# Patient Record
Sex: Female | Born: 1975 | Race: Black or African American | Hispanic: No | Smoking: Former smoker
Health system: Southern US, Community
[De-identification: ages and names within clinical notes are randomized; demographics above are authoritative.]

## PROBLEM LIST (undated history)

## (undated) ENCOUNTER — Ambulatory Visit: Admission: RE | Payer: Self-pay

## (undated) ENCOUNTER — Ambulatory Visit: Admission: RE | Payer: Self-pay | Admitting: Internal Medicine

## (undated) ENCOUNTER — Ambulatory Visit (INDEPENDENT_AMBULATORY_CARE_PROVIDER_SITE_OTHER): Admission: RE | Payer: Self-pay | Admitting: Psychiatry

## (undated) DIAGNOSIS — G35 Multiple sclerosis: Secondary | ICD-10-CM

## (undated) DIAGNOSIS — G35D Multiple sclerosis, unspecified: Secondary | ICD-10-CM

## (undated) DIAGNOSIS — E785 Hyperlipidemia, unspecified: Secondary | ICD-10-CM

## (undated) DIAGNOSIS — E119 Type 2 diabetes mellitus without complications: Secondary | ICD-10-CM

## (undated) DIAGNOSIS — E559 Vitamin D deficiency, unspecified: Secondary | ICD-10-CM

## (undated) DIAGNOSIS — F419 Anxiety disorder, unspecified: Secondary | ICD-10-CM

## (undated) DIAGNOSIS — E782 Mixed hyperlipidemia: Secondary | ICD-10-CM

## (undated) HISTORY — PX: APPENDECTOMY (OPEN): SHX54

## (undated) HISTORY — DX: Mixed hyperlipidemia: E78.2

## (undated) HISTORY — DX: Hyperlipidemia, unspecified: E78.5

## (undated) HISTORY — PX: APPENDECTOMY: SHX54

## (undated) HISTORY — DX: Vitamin D deficiency, unspecified: E55.9

---
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
## 1997-01-31 ENCOUNTER — Inpatient Hospital Stay
Admission: RE | Admit: 1997-01-31 | Disposition: A | Discharge status: Home / Self Care | Payer: Self-pay | Source: Ambulatory Visit | Admitting: Obstetrics & Gynecology

## 1997-05-18 ENCOUNTER — Inpatient Hospital Stay: Admit: 1997-05-18 | Disposition: A | Discharge status: Home / Self Care | Payer: Self-pay | Admitting: Internal Medicine

## 1997-05-24 ENCOUNTER — Ambulatory Visit: Admit: 1997-05-24 | Disposition: A | Discharge status: Home / Self Care | Payer: Self-pay | Admitting: Internal Medicine

## 1998-05-19 ENCOUNTER — Emergency Department: Admit: 1998-05-19 | Disposition: A | Discharge status: Home / Self Care | Payer: Self-pay

## 1998-08-20 ENCOUNTER — Emergency Department: Admit: 1998-08-20 | Disposition: A | Discharge status: Home / Self Care | Payer: Self-pay

## 1998-10-06 ENCOUNTER — Emergency Department
Admit: 1998-10-06 | Disposition: A | Discharge status: Home / Self Care | Payer: Self-pay | Admitting: Emergency Medicine

## 1999-01-09 ENCOUNTER — Emergency Department: Admit: 1999-01-09 | Disposition: A | Discharge status: Home / Self Care | Payer: Self-pay

## 1999-01-12 ENCOUNTER — Inpatient Hospital Stay: Admit: 1999-01-12 | Disposition: A | Discharge status: Home / Self Care | Payer: Self-pay | Admitting: Internal Medicine

## 1999-01-28 ENCOUNTER — Emergency Department
Admit: 1999-01-28 | Disposition: A | Discharge status: Home / Self Care | Payer: Self-pay | Admitting: Emergency Medicine

## 1999-03-08 ENCOUNTER — Ambulatory Visit: Admit: 1999-03-08 | Disposition: A | Discharge status: Home / Self Care | Payer: Self-pay | Admitting: Anesthesiology

## 1999-03-08 ENCOUNTER — Ambulatory Visit: Admit: 1999-03-08 | Disposition: A | Discharge status: Home / Self Care | Payer: Self-pay

## 1999-03-29 ENCOUNTER — Ambulatory Visit: Admit: 1999-03-29 | Disposition: A | Discharge status: Home / Self Care | Payer: Self-pay

## 1999-04-26 ENCOUNTER — Ambulatory Visit: Admit: 1999-04-26 | Disposition: A | Discharge status: Home / Self Care | Payer: Self-pay

## 1999-04-27 ENCOUNTER — Emergency Department
Admit: 1999-04-27 | Disposition: A | Discharge status: Home / Self Care | Payer: Self-pay | Admitting: Emergency Medicine

## 1999-05-03 ENCOUNTER — Ambulatory Visit: Admit: 1999-05-03 | Disposition: A | Discharge status: Home / Self Care | Payer: Self-pay

## 1999-05-17 ENCOUNTER — Ambulatory Visit: Admit: 1999-05-17 | Disposition: A | Discharge status: Home / Self Care | Payer: Self-pay

## 2000-01-20 ENCOUNTER — Inpatient Hospital Stay
Admit: 2000-01-20 | Disposition: A | Discharge status: Home / Self Care | Payer: Self-pay | Admitting: Critical Care Medicine

## 2000-03-05 ENCOUNTER — Emergency Department: Admit: 2000-03-05 | Disposition: A | Discharge status: Home / Self Care | Payer: Self-pay

## 2000-03-07 ENCOUNTER — Emergency Department: Admit: 2000-03-07 | Disposition: A | Discharge status: Home / Self Care | Payer: Self-pay

## 2000-03-23 ENCOUNTER — Emergency Department: Admit: 2000-03-23 | Disposition: A | Discharge status: Home / Self Care | Payer: Self-pay

## 2000-03-27 ENCOUNTER — Emergency Department
Admit: 2000-03-27 | Disposition: A | Discharge status: Home / Self Care | Payer: Self-pay | Admitting: Emergency Medicine

## 2000-04-01 ENCOUNTER — Emergency Department
Admit: 2000-04-01 | Disposition: A | Discharge status: Home / Self Care | Payer: Self-pay | Admitting: Emergency Medicine

## 2000-04-03 ENCOUNTER — Emergency Department: Admit: 2000-04-03 | Disposition: A | Discharge status: Home / Self Care | Payer: Self-pay

## 2000-04-05 ENCOUNTER — Inpatient Hospital Stay: Admit: 2000-04-05 | Disposition: A | Discharge status: Home / Self Care | Payer: Self-pay | Admitting: Internal Medicine

## 2000-04-11 ENCOUNTER — Emergency Department
Admit: 2000-04-11 | Disposition: A | Discharge status: Home / Self Care | Payer: Self-pay | Admitting: Emergency Medicine

## 2000-04-13 ENCOUNTER — Inpatient Hospital Stay: Admit: 2000-04-13 | Disposition: A | Discharge status: Home / Self Care | Payer: Self-pay | Admitting: Internal Medicine

## 2000-05-20 ENCOUNTER — Ambulatory Visit: Admit: 2000-05-20 | Disposition: A | Discharge status: Home / Self Care | Payer: Self-pay

## 2000-05-21 ENCOUNTER — Emergency Department
Admit: 2000-05-21 | Disposition: A | Discharge status: Home / Self Care | Payer: Self-pay | Admitting: Emergency Medicine

## 2000-08-19 ENCOUNTER — Emergency Department
Admit: 2000-08-19 | Disposition: A | Discharge status: Home / Self Care | Payer: Self-pay | Admitting: Emergency Medicine

## 2000-09-08 ENCOUNTER — Emergency Department
Admit: 2000-09-08 | Disposition: A | Discharge status: Home / Self Care | Payer: Self-pay | Admitting: Emergency Medicine

## 2000-12-09 ENCOUNTER — Emergency Department
Admit: 2000-12-09 | Disposition: A | Discharge status: Home / Self Care | Payer: Self-pay | Admitting: Emergency Medicine

## 2000-12-11 ENCOUNTER — Emergency Department
Admit: 2000-12-11 | Disposition: A | Discharge status: Home / Self Care | Payer: Self-pay | Admitting: Emergency Medicine

## 2000-12-29 ENCOUNTER — Emergency Department
Admit: 2000-12-29 | Disposition: A | Discharge status: Home / Self Care | Payer: Self-pay | Admitting: Emergency Medicine

## 2001-01-05 ENCOUNTER — Emergency Department
Admit: 2001-01-05 | Disposition: A | Discharge status: Home / Self Care | Payer: Self-pay | Admitting: Emergency Medicine

## 2001-03-11 ENCOUNTER — Emergency Department
Admit: 2001-03-11 | Disposition: A | Discharge status: Home / Self Care | Payer: Self-pay | Admitting: Emergency Medicine

## 2001-03-15 ENCOUNTER — Emergency Department
Admit: 2001-03-15 | Disposition: A | Discharge status: Home / Self Care | Payer: Self-pay | Admitting: Emergency Medicine

## 2001-04-15 ENCOUNTER — Emergency Department
Admit: 2001-04-15 | Disposition: A | Discharge status: Home / Self Care | Payer: Self-pay | Admitting: Emergency Medicine

## 2001-05-22 ENCOUNTER — Emergency Department: Admit: 2001-05-22 | Disposition: A | Discharge status: Home / Self Care | Payer: Self-pay | Admitting: Internal Medicine

## 2001-07-24 ENCOUNTER — Ambulatory Visit: Admit: 2001-07-24 | Disposition: A | Discharge status: Home / Self Care | Payer: Self-pay | Admitting: Internal Medicine

## 2001-08-18 ENCOUNTER — Emergency Department
Admit: 2001-08-18 | Disposition: A | Discharge status: Home / Self Care | Payer: Self-pay | Admitting: Emergency Medicine

## 2001-09-05 ENCOUNTER — Emergency Department: Admit: 2001-09-05 | Disposition: A | Discharge status: Home / Self Care | Payer: Self-pay | Admitting: Internal Medicine

## 2001-10-14 ENCOUNTER — Ambulatory Visit
Admission: AD | Admit: 2001-10-14 | Disposition: A | Discharge status: Home / Self Care | Payer: Self-pay | Source: Ambulatory Visit | Admitting: Internal Medicine

## 2002-01-07 ENCOUNTER — Ambulatory Visit
Admission: AD | Admit: 2002-01-07 | Disposition: A | Discharge status: Home / Self Care | Payer: Self-pay | Source: Ambulatory Visit | Admitting: Internal Medicine

## 2002-03-23 ENCOUNTER — Inpatient Hospital Stay
Admission: EM | Admit: 2002-03-23 | Disposition: A | Discharge status: Home / Self Care | Payer: Self-pay | Source: Emergency Department | Admitting: Internal Medicine

## 2002-04-28 ENCOUNTER — Emergency Department
Admission: EM | Admit: 2002-04-28 | Disposition: A | Discharge status: Home / Self Care | Payer: Self-pay | Source: Emergency Department | Admitting: Emergency Medicine

## 2003-08-15 ENCOUNTER — Emergency Department
Admission: EM | Admit: 2003-08-15 | Disposition: A | Discharge status: Home / Self Care | Payer: Self-pay | Source: Emergency Department | Admitting: Emergency Medicine

## 2003-12-15 ENCOUNTER — Inpatient Hospital Stay
Admission: EM | Admit: 2003-12-15 | Disposition: A | Discharge status: Home / Self Care | Payer: Self-pay | Source: Emergency Department | Admitting: Family Medicine

## 2003-12-15 ENCOUNTER — Emergency Department: Admit: 2003-12-15 | Payer: Self-pay | Source: Emergency Department

## 2004-02-08 ENCOUNTER — Emergency Department
Admission: EM | Admit: 2004-02-08 | Disposition: A | Discharge status: Home / Self Care | Payer: Self-pay | Source: Emergency Department | Admitting: Emergency Medicine

## 2004-02-14 ENCOUNTER — Ambulatory Visit
Admit: 2004-02-14 | Disposition: A | Discharge status: Home / Self Care | Payer: Self-pay | Source: Ambulatory Visit | Admitting: Obstetrics & Gynecology

## 2004-04-20 ENCOUNTER — Inpatient Hospital Stay (HOSPITAL_BASED_OUTPATIENT_CLINIC_OR_DEPARTMENT_OTHER)
Admission: AD | Admit: 2004-04-20 | Discharge status: Against Medical Advice | Payer: Self-pay | Source: Ambulatory Visit

## 2004-05-28 ENCOUNTER — Inpatient Hospital Stay (HOSPITAL_BASED_OUTPATIENT_CLINIC_OR_DEPARTMENT_OTHER)
Admission: AD | Admit: 2004-05-28 | Disposition: A | Discharge status: Home / Self Care | Payer: Self-pay | Source: Ambulatory Visit

## 2004-06-12 ENCOUNTER — Inpatient Hospital Stay (INDEPENDENT_AMBULATORY_CARE_PROVIDER_SITE_OTHER): Admit: 2004-06-12 | Disposition: A | Discharge status: Home / Self Care | Payer: Self-pay | Source: Ambulatory Visit

## 2004-06-22 ENCOUNTER — Inpatient Hospital Stay (INDEPENDENT_AMBULATORY_CARE_PROVIDER_SITE_OTHER): Admit: 2004-06-22 | Disposition: A | Discharge status: Home / Self Care | Payer: Self-pay | Source: Ambulatory Visit

## 2004-07-09 ENCOUNTER — Inpatient Hospital Stay (HOSPITAL_BASED_OUTPATIENT_CLINIC_OR_DEPARTMENT_OTHER)
Admission: AD | Admit: 2004-07-09 | Disposition: A | Discharge status: Home / Self Care | Payer: Self-pay | Source: Ambulatory Visit

## 2004-07-17 ENCOUNTER — Inpatient Hospital Stay (INDEPENDENT_AMBULATORY_CARE_PROVIDER_SITE_OTHER): Admit: 2004-07-17 | Disposition: A | Discharge status: Home / Self Care | Payer: Self-pay | Source: Ambulatory Visit

## 2004-08-07 ENCOUNTER — Observation Stay (HOSPITAL_BASED_OUTPATIENT_CLINIC_OR_DEPARTMENT_OTHER)
Admission: RE | Admit: 2004-08-07 | Disposition: A | Discharge status: Home / Self Care | Payer: Self-pay | Source: Ambulatory Visit

## 2004-08-18 ENCOUNTER — Inpatient Hospital Stay (INDEPENDENT_AMBULATORY_CARE_PROVIDER_SITE_OTHER): Admit: 2004-08-18 | Disposition: A | Discharge status: Home / Self Care | Payer: Self-pay | Source: Ambulatory Visit

## 2004-08-28 ENCOUNTER — Inpatient Hospital Stay (HOSPITAL_BASED_OUTPATIENT_CLINIC_OR_DEPARTMENT_OTHER)
Admission: RE | Admit: 2004-08-28 | Disposition: A | Discharge status: Home / Self Care | Payer: Self-pay | Source: Ambulatory Visit

## 2004-10-31 ENCOUNTER — Inpatient Hospital Stay: Admit: 2004-10-31 | Disposition: A | Discharge status: Home / Self Care | Payer: Self-pay | Source: Ambulatory Visit

## 2010-10-30 ENCOUNTER — Encounter (INDEPENDENT_AMBULATORY_CARE_PROVIDER_SITE_OTHER): Payer: Self-pay

## 2010-11-03 ENCOUNTER — Ambulatory Visit (INDEPENDENT_AMBULATORY_CARE_PROVIDER_SITE_OTHER): Payer: BC Managed Care – PPO | Admitting: Internal Medicine

## 2010-11-03 ENCOUNTER — Encounter (INDEPENDENT_AMBULATORY_CARE_PROVIDER_SITE_OTHER): Payer: Self-pay | Admitting: Internal Medicine

## 2010-11-03 VITALS — BP 116/80 | HR 103 | Temp 98.6°F | Resp 18 | Ht 64.5 in | Wt 111.6 lb

## 2010-11-03 DIAGNOSIS — IMO0002 Reserved for concepts with insufficient information to code with codable children: Secondary | ICD-10-CM

## 2010-11-03 DIAGNOSIS — E782 Mixed hyperlipidemia: Secondary | ICD-10-CM

## 2010-11-03 MED ORDER — INSULIN SYRINGE-NEEDLE U-100 31G X 5/16" 0.5 ML MISC
36.00 [IU] | Freq: Every day | Status: DC
Start: 2010-11-03 — End: 2011-10-09

## 2010-11-03 MED ORDER — INSULIN GLARGINE 100 UNIT/ML SC SOLN
36.00 [IU] | Freq: Every day | SUBCUTANEOUS | Status: DC
Start: 2010-11-03 — End: 2011-01-09

## 2010-11-03 MED ORDER — METFORMIN HCL ER 500 MG PO TB24
500.00 mg | ORAL_TABLET | Freq: Every morning | ORAL | Status: DC
Start: 2010-11-03 — End: 2011-09-06

## 2010-11-03 NOTE — Progress Notes (Signed)
Subjective:       Patient ID: Sara Ball is a 35 y.o. female.    Diabetes  She presents for her follow-up diabetic visit. She has type 1 diabetes mellitus. No MedicAlert identification noted. Her disease course has been worsening. Hypoglycemia symptoms include tremors. Associated symptoms include weakness. Pertinent negatives for diabetes include no blurred vision, no fatigue, no foot paresthesias, no polydipsia, no polyphagia, no polyuria, no visual change and no weight loss. There are no hypoglycemic complications. Symptoms are stable. Symptoms have been present for 14 days. Pertinent negatives for diabetic complications include no retinopathy. Risk factors for coronary artery disease include dyslipidemia and diabetes mellitus. Current diabetic treatment includes insulin injections and diet. She is compliant with treatment some of the time. Her home blood glucose trend is fluctuating dramatically.         The following portions of the patient's history were reviewed and updated as appropriate: allergies, current medications, past family history, past medical history, past social history, past surgical history and problem list.    Review of Systems   Constitutional: Negative for weight loss and fatigue.   HENT: Negative.    Eyes: Negative for blurred vision.   Respiratory: Negative.    Cardiovascular: Negative.    Gastrointestinal: Negative.    Genitourinary: Negative for polyuria.   Neurological: Positive for tremors and weakness.   Hematological: Negative for polydipsia and polyphagia.   Psychiatric/Behavioral: Negative.    All other systems reviewed and are negative.            Objective:    Physical Exam   Constitutional: She appears well-developed and well-nourished.   HENT:   Head: Normocephalic.   Mouth/Throat: Oropharynx is clear and moist.   Eyes: Conjunctivae and EOM are normal. Pupils are equal, round, and reactive to light.        Fundi : normal bilaterally   Neck: Normal range of motion.    Cardiovascular: Normal rate, regular rhythm, normal heart sounds and intact distal pulses.    Pulmonary/Chest: Effort normal and breath sounds normal. No respiratory distress. She has no wheezes. She has no rales.   Abdominal: Soft. Bowel sounds are normal.   Musculoskeletal: Normal range of motion. She exhibits no edema and no tenderness.   Neurological: She is alert. She has normal reflexes. She exhibits normal muscle tone. Coordination normal.   Skin: Skin is warm and dry.     Filed Vitals:    11/03/10 1119   BP: 116/80   Pulse: 103   Temp: 98.6 F (37 C)   TempSrc: Temporal Artery   Resp: 18   Height: 5' 4.5" (1.638 m)   Weight: 111 lb 9.6 oz (50.621 kg)   SpO2: 98%           Assessment:       1. Type I (juvenile type) diabetes mellitus without mention of complication, uncontrolled  insulin glargine (LANTUS) 100 UNIT/ML injection, Insulin Syringe-Needle U-100 (B-D INS SYRINGE 0.5CC/31GX5/16) 31G X 5/16" 0.5 ML MISC, metFORMIN (GLUCOPHAGE XR) 500 MG 24 hr tablet   2. Mixed hyperlipidemia             Plan:    1. I explained the risks and complications of D.M. The importance of following diabetic diet, and regular exercises have been explained. Diabetic feet care and importance of regular eye examination have been explained. FSBS should be checked daily as explained. Bring blood sugar records at next visit.       She was advised  and given Rx for Lantus 36 unilts qam. She was started on Metformin XR 500 mg Qam  2.The risks and complications of dyslipidemia have been explained. Importance of low fat diet, and aerobic exercises have been explained.  She will start Lipitor 10 mg Q day.

## 2011-01-09 ENCOUNTER — Ambulatory Visit (INDEPENDENT_AMBULATORY_CARE_PROVIDER_SITE_OTHER): Payer: BC Managed Care – PPO | Admitting: Internal Medicine

## 2011-01-09 ENCOUNTER — Encounter (INDEPENDENT_AMBULATORY_CARE_PROVIDER_SITE_OTHER): Payer: Self-pay | Admitting: Internal Medicine

## 2011-01-09 VITALS — BP 110/70 | HR 80 | Temp 97.8°F | Resp 17 | Ht 64.0 in | Wt 116.0 lb

## 2011-01-09 DIAGNOSIS — E782 Mixed hyperlipidemia: Secondary | ICD-10-CM

## 2011-01-09 DIAGNOSIS — F411 Generalized anxiety disorder: Secondary | ICD-10-CM

## 2011-01-09 DIAGNOSIS — IMO0002 Reserved for concepts with insufficient information to code with codable children: Secondary | ICD-10-CM

## 2011-01-09 DIAGNOSIS — G459 Transient cerebral ischemic attack, unspecified: Secondary | ICD-10-CM

## 2011-01-09 MED ORDER — CLONAZEPAM 1 MG PO TABS
1.00 mg | ORAL_TABLET | Freq: Every day | ORAL | Status: DC
Start: 2011-01-09 — End: 2011-02-22

## 2011-01-09 NOTE — Progress Notes (Signed)
Subjective:       Patient ID: Sara Ball is a 35 y.o. female.  Chief Complaint   Patient presents with   . Hypoglycemia     SHe went to the ER Saturday from passing out.   . Transient Ischemic Attack       Hypoglycemia  She presents for her initial diabetic visit. She has type 1 diabetes mellitus. No MedicAlert identification noted. Her disease course has been stable. Hypoglycemia symptoms include dizziness, headaches, hunger, nervousness/anxiousness and sleepiness. Pertinent negatives for hypoglycemia include no speech difficulty or tremors. (Coma following hypoglycemia on 01/07/11. Her blood sugar dropped from 350 to 20 after she took 4 units of Novolog.) Associated symptoms include weakness (right sided). Hypoglycemia complications include hospitalization (she was admiited for 24 hrs at [facility] hospital in [place].). Diabetic complications include a CVA (T.I.A. before episode of Hypoglycemia). Risk factors for coronary artery disease include dyslipidemia and diabetes mellitus. She is compliant with treatment most of the time. Her weight is stable. She is following a diabetic diet. When asked about meal planning, she reported none. She has had a previous visit with a dietician. She participates in exercise intermittently. An ACE inhibitor/angiotensin II receptor blocker is not being taken.     T.I.A. : she had sudden weakness and numbness of Right upper and lower limb in afternoon of 01/07/11. EMS came and her sugar was 350. Symptoms resolved in few minutes. She was advised to take 4 units of Novolog by EMS. Her blood sugar dropped in 2 hrs and she passe out. She was admitted to [facility] and had EEG. CT brain Carotid duplex and Echo.          The following portions of the patient's history were reviewed and updated as appropriate: allergies, current medications, past family history, past medical history, past social history, past surgical history and problem list.    Review of Systems   Respiratory:  Negative.    Cardiovascular: Negative.    Gastrointestinal: Negative.    Neurological: Positive for dizziness, syncope, weakness (right sided), light-headedness and headaches. Negative for tremors and speech difficulty.   Psychiatric/Behavioral: The patient is nervous/anxious.    All other systems reviewed and are negative.            Objective:    Physical Exam   Constitutional: She is oriented to person, place, and time. She appears well-developed and well-nourished.   HENT:   Head: Normocephalic.   Right Ear: External ear normal.   Nose: Nose normal.   Mouth/Throat: Oropharynx is clear and moist.        T.M. Normal bilaterally.  Lips: Normal   Eyes: Conjunctivae and EOM are normal. Pupils are equal, round, and reactive to light.        Fundi: Discs sharp, no hemorrhage or exudate bilaterally.       Neck: Normal range of motion. Neck supple. No JVD present. No tracheal deviation present. No thyromegaly present.   Cardiovascular: Normal rate, regular rhythm, normal heart sounds and intact distal pulses.  Exam reveals no gallop.         Pedal Pulses: normal bilaterally.  No pedal edema.  No carotid bruits.  No abdominal bruits.   Pulmonary/Chest: Effort normal and breath sounds normal. No respiratory distress. She has no wheezes. She has no rales. She exhibits no tenderness.   Abdominal: Soft. Bowel sounds are normal. She exhibits no distension and no mass. There is no tenderness. There is no rebound.  No hepatospleenomegaly.  No hernia.   Musculoskeletal: Normal range of motion. She exhibits no edema and no tenderness.        Normal muscle tone and normal ROM of all joints of all four limbs.   Lymphadenopathy:     She has no cervical adenopathy.   Neurological: She is alert and oriented to person, place, and time. She has normal strength and normal reflexes. No cranial nerve deficit or sensory deficit. She displays a negative Romberg sign. Coordination normal.        Normal strength and reflexes of all four  limbs.   Skin: Skin is dry. No rash noted.   Psychiatric: She has a normal mood and affect. Her behavior is normal. Judgment and thought content normal.           Filed Vitals:    01/09/11 1436   BP: 110/70   Pulse: 80   Temp: 97.8 F (36.6 C)   Resp: 17       Assessment:       1. Type I (juvenile type) diabetes mellitus with hypoglycemic episode, uncontrolled    2. Mixed hyperlipidemia    3. Transient ischemic attack causing transient right sided weakness and numbness.    4. Generalized anxiety disorder            Plan:       1. I explained the risks and complications of D.M. The importance of following diabetic diet, and regular exercises have been explained. Diabetic feet care and importance of regular eye examination have been explained. FSBS should be checked daily as explained. Bring blood sugar records at next visit.  She will continue Lantus insulin 25 units Q acb. She will take Novolog before meals if needed. She will not take > 2 units at a time. She has stopped taking Glucophage.    2. The risks and complications of dyslipidemia have been explained. Importance of low fat diet, and aerobic exercises have been explained.  She was advised to restart Lipitor 10 mg Q am. She stopped this 1 week after lasr Rx.  3.she was advised to start ECASA 81 mg Q am. I have requested medical records from [facility].  4. She was given Klonopin 1 mg Q hs prn % 30 x 0.

## 2011-01-10 ENCOUNTER — Encounter (INDEPENDENT_AMBULATORY_CARE_PROVIDER_SITE_OTHER): Payer: Self-pay | Admitting: Internal Medicine

## 2011-01-10 ENCOUNTER — Ambulatory Visit (INDEPENDENT_AMBULATORY_CARE_PROVIDER_SITE_OTHER): Payer: BC Managed Care – PPO | Admitting: Internal Medicine

## 2011-01-10 VITALS — BP 128/86 | HR 104 | Temp 99.0°F | Resp 17 | Ht 64.0 in | Wt 116.0 lb

## 2011-01-10 DIAGNOSIS — F4323 Adjustment disorder with mixed anxiety and depressed mood: Secondary | ICD-10-CM

## 2011-01-10 MED ORDER — ALPRAZOLAM 0.5 MG PO TABS
0.50 mg | ORAL_TABLET | Freq: Two times a day (BID) | ORAL | Status: DC | PRN
Start: 2011-01-10 — End: 2011-02-22

## 2011-01-10 NOTE — Patient Instructions (Addendum)
Stress Relief: Activities  When you're feeling stressed, some simple exercises can provide relief right away. These exercises are not the kind you need sweatpants for. You can do them almost anytime and anywhere. They will help you feel more relaxed.    Walking  Taking a walk is a great way to fight stress. Walking offers a chance to take a break from a stressful situation. It can also give you a few minutes to think things through. Even a short walk can help you feel better. That's because walking is a positive action that you control.    Stretching  Muscle tension is a common response to stress. Stretching is a simple way to loosen up. Try these:   Neck stretch. Sit up straight and tuck in your chin. Place your left hand on the right side of your head. Gently pull your head to the left and hold for 10 seconds. Switch sides and repeat the exercise.   Shoulder and arm stretch. Put your hands together and lock your fingers. Then raise your hands above your head, palms upward. Hold for 15 seconds and relax. Repeat 3 times.  Deep Breathing  Deep breathing is a simple method for relieving tension. Use 3 deep breaths each time you do this exercise.   Inhale. Breathe in slowly and deeply through your nose. Take in as much air as possible. Hold for 3 seconds.   Exhale. Breathe out slowly through your mouth. Try pursing your lips as if you were going to whistle. This helps control how fast you exhale.   [address], [address] [place], [place] [address]. All rights reserved. This information is not intended as a substitute for professional medical care. Always follow your healthcare professional's instructions.  Stress Relief: A Positive Lifestyle  Learning to manage stress doesn't happen overnight. It's a process. The more you keep at it, the more you'll feel in control of daily events.     Leave plenty of time for family fun. Have a cookout or play games together. And try to share at least one  meal each day.   Set Limits  Trying to fit too much into a day is a major cause of stress. Setting limits will help you feel more in control. This sometimes means saying no--to people and even to things you might want to do. This can be hard at times. But knowing your priorities can help you make choices.  Know Your Priorities  Using your time and energy wisely is a good way to control stress. Save time for the things that matter most in your life. Ask yourself: Do I really need to do this? Do I want to do this? If you answer "yes," go ahead. But keep in mind you can also answer "no."  Learn to Accept Support  Everybody needs support now and then. So don't feel embarrassed to ask for help when you need it. Most people are glad to lend a hand. And asking for help can open up new lines of communication and friendships.  Stress and Children  Be careful not to take stress out on your children. They may not understand why you're stressed. But they can sense your moods. Be aware that many children--especially teenagers--are under stress, too. So plan time to talk with your kids. Ask them about school and any problems they're having. Finally, make sure they have plenty of time for just being kids and having fun.  Be Part of Your Community  Taking  part in community or faith-based events can offer a sense of belonging. It also helps put you in touch with active and caring people nearby. So whether it's a cleanup day at a local park or taking meals to the elderly, try to reach out to friends and neighbors. Just remember: Taking time for yourself once in a while makes it easier to help others later on.    [address], [place], [place] [address]. All rights reserved. This information is not intended as a substitute for professional medical care. Always follow your healthcare professional's instructions.

## 2011-01-10 NOTE — Progress Notes (Signed)
Subjective:       Patient ID: Sara Ball is a 35 y.o. female.  Chief Complaint   Patient presents with   . Stress     This has been going on for awhile.       Anxiety  Presents for follow-up visit. Symptoms include chest pain, decreased concentration, depressed mood, dry mouth, excessive worry, feeling of choking, insomnia, nervous/anxious behavior, palpitations, panic and restlessness. Patient reports no shortness of breath or suicidal ideas. Primary symptoms comment: She has 2 chidren who are frequntly in trouble at school. Her daughter got suspended yesterday because she hit another child at school. Patient is very worried and crying. She is having difficulty in handleing her children.  Symptoms occur constantly. The severity of symptoms is moderate. The quality of sleep is fair. Nighttime awakenings: none.     Compliance with medications is 76-100%.   She took Klonopin at bedtime. No improvement in stress and anxiety today.    The following portions of the patient's history were reviewed and updated as appropriate: allergies, current medications, past family history, past medical history, past social history, past surgical history and problem list.    Review of Systems   Respiratory: Negative.  Negative for shortness of breath.    Cardiovascular: Positive for chest pain and palpitations.   Gastrointestinal: Negative.    Neurological: Positive for headaches.   Psychiatric/Behavioral: Positive for disturbed wake/sleep cycle, dysphoric mood, decreased concentration and agitation. Negative for suicidal ideas. The patient is nervous/anxious and has insomnia.    All other systems reviewed and are negative.            Objective:    Physical Exam   Constitutional: She is oriented to person, place, and time. She appears well-developed and well-nourished.   Cardiovascular: Normal rate, regular rhythm, S1 normal, S2 normal, intact distal pulses and normal pulses.  Exam reveals no gallop.    Neurological: She is alert  and oriented to person, place, and time. She has normal reflexes. No cranial nerve deficit or sensory deficit. She displays a negative Romberg sign.   Skin: Skin is warm. She is not diaphoretic.   Psychiatric: Her speech is normal. Judgment and thought content normal. Her mood appears anxious. She is agitated. Cognition and memory are normal. She exhibits a depressed mood.     Filed Vitals:    01/10/11 1432   BP: 128/86   Pulse: 104   Temp: 99 F (37.2 C)   TempSrc: Temporal Artery   Resp: 17   Height: 1.626 m (5\' 4" )   Weight: 52.617 kg (116 lb)   SpO2: 98%           Assessment:       1. Adjustment disorder with mixed anxiety and depressed mood            Plan:       She was recomeded counseling, but she refuses at present. She will continue Klonopin 1 mg Q hs. She was given Xanax 0.5 mg BID prn for severe anxiety or panic # 30 x 0. Side effects are explained. Printed instructions for stress management are given.

## 2011-01-23 ENCOUNTER — Encounter (INDEPENDENT_AMBULATORY_CARE_PROVIDER_SITE_OTHER): Payer: Self-pay | Admitting: Internal Medicine

## 2011-01-23 ENCOUNTER — Ambulatory Visit (INDEPENDENT_AMBULATORY_CARE_PROVIDER_SITE_OTHER): Payer: BC Managed Care – PPO | Admitting: Internal Medicine

## 2011-01-23 VITALS — BP 120/78 | HR 98 | Temp 98.2°F | Resp 17

## 2011-01-23 DIAGNOSIS — N83209 Unspecified ovarian cyst, unspecified side: Secondary | ICD-10-CM

## 2011-01-23 DIAGNOSIS — R1032 Left lower quadrant pain: Secondary | ICD-10-CM

## 2011-01-23 MED ORDER — HYDROCODONE-ACETAMINOPHEN 5-500 MG PO TABS
1.00 | ORAL_TABLET | Freq: Four times a day (QID) | ORAL | Status: DC | PRN
Start: 2011-01-23 — End: 2011-02-07

## 2011-01-23 NOTE — Progress Notes (Signed)
Subjective:       Patient ID: Sara Ball is a 35 y.o. female.  Chief Complaint   Patient presents with   . Abdominal Pain     Which goes around tho her lower back. Since Thursday       Abdominal Pain  This is a new problem. The current episode started in the past 7 days (6 dyas ago.). The onset quality is sudden. The problem occurs constantly. The problem has been waxing and waning. The pain is located in the LLQ. The pain is at a severity of 7/10. The pain is moderate. The quality of the pain is colicky. The abdominal pain radiates to the back. Associated symptoms include anorexia and nausea. Pertinent negatives include no constipation, diarrhea, dysuria, fever, frequency or vomiting. Associated symptoms comments: She stared having vaginal bleeding 6 days ago. She continues to have heavy periods. Last M.P. Before this one was on 01/05/11.. The pain is relieved by nothing. She has tried oral narcotic analgesics for the symptoms. The treatment provided moderate relief. Prior diagnostic workup includes ultrasound.   She saw Gyn yesterday and had pelvic examination and cultures. Reports are pending.      The following portions of the patient's history were reviewed and updated as appropriate: allergies, current medications, past family history, past medical history, past social history, past surgical history and problem list.    Review of Systems   Constitutional: Negative for fever and unexpected weight change.   Gastrointestinal: Positive for nausea, abdominal pain and anorexia. Negative for vomiting, diarrhea, constipation, blood in stool, anal bleeding and rectal pain.   Genitourinary: Positive for vaginal bleeding, menstrual problem and pelvic pain. Negative for dysuria and frequency.   [all other systems reviewed and are negative            Objective:    Physical Exam   Constitutional: She is oriented to person, place, and time. She appears well-developed and well-nourished.   Cardiovascular: Normal rate,  regular rhythm and normal heart sounds.    Pulmonary/Chest: Effort normal and breath sounds normal. No respiratory distress. She has no wheezes. She has no rales. She exhibits no tenderness.   Abdominal: Soft. Bowel sounds are normal. She exhibits no distension and no mass. There is tenderness (LLQ- moderate). There is no rebound and no guarding.   Neurological: She is alert and oriented to person, place, and time. No cranial nerve deficit. Coordination normal.   Skin: Skin is warm and dry. No rash noted. She is not diaphoretic. No erythema. No pallor.   Psychiatric: Her speech is normal and behavior is normal. Judgment and thought content normal. Her mood appears anxious. Cognition and memory are normal.     Filed Vitals:    01/23/11 1132   BP: 120/78   Pulse: 98   Temp: 98.2 F (36.8 C)   Resp: 17         Assessment:       1. Abdominal pain, left lower quadrant  HYDROcodone-acetaminophen (VICODIN) 5-500 MG per tablet   2. Ovarian cyst-Left 4 x 3 cm             Plan:       she was given Vicodin 5 mg Q 6 hr prn # 30 x 0. She was given 20 pills from ER and ran out of this.  She will continue Naprosyn 500 mg BID. She will get cervical culture report. If it is negative and pain does not get better in few days, she  may need CT abd and pelvis.

## 2011-01-26 ENCOUNTER — Other Ambulatory Visit (INDEPENDENT_AMBULATORY_CARE_PROVIDER_SITE_OTHER): Payer: Self-pay | Admitting: Internal Medicine

## 2011-01-26 ENCOUNTER — Telehealth (INDEPENDENT_AMBULATORY_CARE_PROVIDER_SITE_OTHER): Payer: Self-pay

## 2011-01-26 DIAGNOSIS — R1032 Left lower quadrant pain: Secondary | ICD-10-CM

## 2011-01-26 DIAGNOSIS — N83209 Unspecified ovarian cyst, unspecified side: Secondary | ICD-10-CM

## 2011-01-26 NOTE — Telephone Encounter (Signed)
Patient call she is still having the pains. She will like to pick up a order for her Abd and pelvis CT scan. She wants to go to [facility]. Thanks

## 2011-02-01 ENCOUNTER — Other Ambulatory Visit (INDEPENDENT_AMBULATORY_CARE_PROVIDER_SITE_OTHER): Payer: Self-pay | Admitting: Internal Medicine

## 2011-02-01 ENCOUNTER — Ambulatory Visit (INDEPENDENT_AMBULATORY_CARE_PROVIDER_SITE_OTHER): Payer: BC Managed Care – PPO | Admitting: Internal Medicine

## 2011-02-01 ENCOUNTER — Telehealth (INDEPENDENT_AMBULATORY_CARE_PROVIDER_SITE_OTHER): Payer: Self-pay

## 2011-02-01 ENCOUNTER — Encounter (INDEPENDENT_AMBULATORY_CARE_PROVIDER_SITE_OTHER): Payer: Self-pay | Admitting: Internal Medicine

## 2011-02-01 VITALS — BP 110/76 | HR 100 | Temp 97.8°F | Resp 16 | Wt 111.0 lb

## 2011-02-01 DIAGNOSIS — Z888 Allergy status to other drugs, medicaments and biological substances status: Secondary | ICD-10-CM

## 2011-02-01 DIAGNOSIS — IMO0002 Reserved for concepts with insufficient information to code with codable children: Secondary | ICD-10-CM

## 2011-02-01 DIAGNOSIS — L500 Allergic urticaria: Secondary | ICD-10-CM

## 2011-02-01 NOTE — Telephone Encounter (Signed)
Patient went to get her CT scan done the other day. She found out she is allergic to the dye. She tried to eat after she left but she it made her sick. She went to work and when she came home she couldn't find her meter. But she could feel it was high. She took 10 units of her Novalog. She found her meter around 7 pm and when she checked it, it was 373. She checked it again this morning and it was 488. So she took another 10 units of her Novalog.  She has not eaten in the past two days. She will like to know what she should do.

## 2011-02-01 NOTE — Progress Notes (Signed)
Subjective:       Patient ID: Sara Ball is a 35 y.o. female.  Chief Complaint   Patient presents with   . Diabetes     Her reading was 488 this morning   . Urticaria       Diabetes  She presents for her follow-up diabetic visit. She has type 1 diabetes mellitus. Her disease course has been fluctuating. Hypoglycemia symptoms include headaches and nervousness/anxiousness. Associated symptoms include fatigue and weakness. There are no hypoglycemic complications. Symptoms are stable. She sees a podiatrist.Eye exam is current.   Urticaria  This is a new problem. The current episode started yesterday. The problem has been gradually improving since onset. The affected locations include the left lower leg, right lower leg, abdomen, face and lips. The rash is characterized by swelling, burning and itchiness. Associated with:  I V dye for C T scan. Associated symptoms include anorexia, fatigue and vomiting. Pertinent negatives include no fever. Past treatments include antihistamine. The treatment provided moderate relief. Her past medical history is significant for asthma. There is no history of allergies.   Her blood sugar is around 300 now.  She had C T abdomin with I v dye yesterday. She had rash and itching after I V dye. She was given benadryl which helped.     The following portions of the patient's history were reviewed and updated as appropriate: allergies, current medications, past family history, past medical history, past social history, past surgical history and problem list.    Review of Systems   Constitutional: Positive for fatigue. Negative for fever and unexpected weight change.   Eyes: Negative.    Gastrointestinal: Positive for nausea, vomiting, abdominal pain (diffuse) and anorexia. Negative for blood in stool.   Genitourinary: Negative.    Neurological: Positive for weakness and headaches. Negative for numbness.   Psychiatric/Behavioral: Positive for decreased concentration. The patient is  nervous/anxious.    All other systems reviewed and are negative.            Objective:    Physical Exam   Constitutional: She is oriented to person, place, and time. She appears well-developed and well-nourished.   HENT:   Mouth/Throat: Oropharynx is clear and moist.   Eyes: Conjunctivae and EOM are normal. Pupils are equal, round, and reactive to light.   Neck: Normal range of motion. Neck supple. No JVD present. No thyromegaly present.   Cardiovascular: Normal rate, regular rhythm and normal heart sounds.    Pulmonary/Chest: Effort normal and breath sounds normal. No respiratory distress. She has no wheezes. She exhibits no tenderness.   Abdominal: Soft. Bowel sounds are normal. She exhibits no mass. There is tenderness (mild diffuse). There is no rebound and no guarding.   Musculoskeletal: Normal range of motion. She exhibits no tenderness.   Lymphadenopathy:     She has no cervical adenopathy.   Neurological: She is alert and oriented to person, place, and time. She has normal reflexes. She displays normal reflexes. No cranial nerve deficit. She exhibits normal muscle tone. Coordination normal.   Skin: She is diaphoretic.           Filed Vitals:    02/01/11 1005   BP: 110/76   Pulse: 100   Temp: 97.8 F (36.6 C)   TempSrc: Temporal Artery   Resp: 16   Weight: 50.349 kg (111 lb)   SpO2: 99%     FSBS : 312   Assessment:       1. Type I (juvenile  type) diabetes mellitus without mention of complication, uncontrolled  Basic Metabolic Panel, CBC and differential   2. Allergic urticaria     3. Drug allergy to C T radiographic contrast dye.             Plan:       1. She will resume Lantus and sliding scale. She will hold metformin for 2 days. Oral fluids are advised. Resume diet. Labs are ordered.  2. She will use OTC benadryl 50 mg Q 6hr prn.  3. She should take X ray dye only with proper premedications before test. Avod dye if possible.

## 2011-02-02 ENCOUNTER — Emergency Department
Admit: 2011-02-02 | Discharge: 2011-02-02 | Disposition: A | Discharge status: Home / Self Care | Payer: Self-pay | Source: Emergency Department | Admitting: Emergency Medicine

## 2011-02-02 LAB — CBC AND DIFFERENTIAL
Atypical Lymphocytes Absolute: 0 %
Baso(Absolute): 48 cells/uL (ref 0–200)
Basophils Absolute Automated: 0.06 10*3/uL (ref 0.00–0.20)
Basophils Automated: 1 % (ref 0–2)
Basophils: 0.7 %
Eosinophils Absolute Automated: 0.02 10*3/uL (ref 0.00–0.70)
Eosinophils Absolute: 27 cells/uL (ref 15–500)
Eosinophils Automated: 0 % (ref 0–5)
Eosinophils: 0.4 %
Hematocrit: 38.5 % (ref 35.0–45.0)
Hematocrit: 36.5 % — ABNORMAL LOW (ref 37.0–47.0)
Hemoglobin: 12.4 g/dL (ref 11.7–15.5)
Hgb: 12 g/dL (ref 12.0–16.0)
Lymphocytes Absolute Automated: 2.17 10*3/uL (ref 0.50–4.40)
Lymphocytes Absolute: 1584 cells/uL (ref 850–3900)
Lymphocytes Automated: 30 % (ref 15–41)
Lymphocytes: 23.3 %
MCH: 26.1 pg — ABNORMAL LOW (ref 27–33)
MCH: 25.3 pg — ABNORMAL LOW (ref 28.0–32.0)
MCHC: 32.2 g/dL (ref 32–36)
MCHC: 32.9 g/dL (ref 32.0–36.0)
MCV: 81 fL (ref 80–100)
MCV: 76.8 fL — ABNORMAL LOW (ref 80.0–100.0)
MPV: 9.6 fL (ref 7.5–11.5)
MPV: 10.9 fL (ref 9.4–12.3)
Monocytes Absolute Automated: 0.57 10*3/uL (ref 0.00–1.20)
Monocytes Absolute: 333 cells/uL (ref 200–950)
Monocytes: 4.9 %
Monocytes: 8 % (ref 0–11)
Neutrophils Absolute: 4808 cells/uL (ref 1500–7800)
Neutrophils Absolute: 4.5 10*3/uL (ref 1.80–8.10)
Neutrophils: 70.7 %
Neutrophils: 62 % (ref 52–75)
Platelets: 259 10*3/uL (ref 140–400)
Platelets: 276 10*3/uL (ref 140–400)
RBC: 4.76 10*6/uL (ref 3.80–5.10)
RBC: 4.75 10*6/uL (ref 4.20–5.40)
RDW: 15 % (ref 11.0–15.0)
RDW: 14 % (ref 12–15)
WBC: 6.8 10*3/uL (ref 3.8–10.8)
WBC: 7.32 10*3/uL (ref 3.50–10.80)

## 2011-02-02 LAB — GFR: EGFR: >60

## 2011-02-02 LAB — BASIC METABOLIC PANEL
Anion Gap: 18 — ABNORMAL HIGH (ref 5.0–15.0)
BUN: 20 MG/DL (ref 7–25)
BUN: 16 mg/dL (ref 7–21)
CO2: 22 mmol/L (ref 21–33)
CO2: 22 mEq/L (ref 22–31)
Calcium: 9.9 MG/DL (ref 8.6–10.2)
Calcium: 10 mg/dL (ref 8.6–10.2)
Chloride: 100 mmol/L (ref 98–110)
Chloride: 98 mEq/L (ref 98–107)
Creatinine: 0.99 mg/dL (ref 0.50–1.10)
Creatinine: 0.8 mg/dL (ref 0.5–1.4)
EGFR: 75 mL/min/{1.73_m2} (ref >=60)
EGFR: 87 mL/min/{1.73_m2} (ref >=60)
Glucose: 267 MG/DL — ABNORMAL HIGH (ref 65–99)
Glucose: 156 mg/dL — ABNORMAL HIGH (ref 70–100)
Potassium: 4.5 mmol/L (ref 3.5–5.3)
Potassium: 4.4 mEq/L (ref 3.6–5.0)
Sodium: 137 mmol/L (ref 135–146)
Sodium: 138 mEq/L (ref 136–143)

## 2011-02-02 LAB — URINALYSIS, REFLEX TO MICROSCOPIC EXAM IF INDICATED
Blood, UA: NEGATIVE
Glucose, UA: 250 — AB
Ketones UA: >=80 — AB
Leukocyte Esterase, UA: NEGATIVE
Nitrite, UA: NEGATIVE
Protein, UR: 100 — AB
Specific Gravity UA POCT: 1.026 (ref 1.001–1.035)
Urine pH: 6 (ref 5.0–8.0)
Urobilinogen, UA: 2 mg/dL

## 2011-02-02 LAB — HEPATIC FUNCTION PANEL
ALT: 34 U/L (ref 7–56)
AST (SGOT): 33 U/L (ref 5–40)
Albumin/Globulin Ratio: 1.5 (ref 1.1–1.8)
Albumin: 5.1 g/dL (ref 3.7–5.1)
Alkaline Phosphatase: 119 U/L (ref 43–122)
Bilirubin Direct: 0.5 mg/dL — ABNORMAL HIGH (ref 0.0–0.3)
Bilirubin Indirect: 0.4 mg/dL (ref 0.0–1.1)
Bilirubin, Total: 0.9 mg/dL (ref 0.2–1.3)
Globulin: 3.4 g/dL (ref 2.0–3.7)
Protein, Total: 8.5 g/dL — ABNORMAL HIGH (ref 6.0–8.0)

## 2011-02-02 LAB — ACETONE: Acetone Blood: POSITIVE — AB

## 2011-02-02 LAB — URINE ICTOTEST: Urine Ictotest: NEGATIVE

## 2011-02-02 LAB — HCG, SERUM, QUALITATIVE: Hcg Qualitative: NEGATIVE

## 2011-02-02 LAB — LIPASE: Lipase: 294 U/L (ref 23–300)

## 2011-02-05 ENCOUNTER — Other Ambulatory Visit (INDEPENDENT_AMBULATORY_CARE_PROVIDER_SITE_OTHER): Payer: Self-pay | Admitting: Internal Medicine

## 2011-02-05 ENCOUNTER — Telehealth (INDEPENDENT_AMBULATORY_CARE_PROVIDER_SITE_OTHER): Payer: Self-pay

## 2011-02-05 NOTE — Telephone Encounter (Signed)
Message copied by Clent Demark on Mon Feb 05, 2011  3:39 PM  ------       Message from: Sheran Luz       Created: Mon Feb 05, 2011 12:15 PM       Contact: [phone]         She left a message earlier, but would like to be called on this number.  [phone]

## 2011-02-05 NOTE — Telephone Encounter (Signed)
Patient said she can not get into see a Gyn until December 11 th. That's the earliest. She is in a lot of pain and she wants to know what can she do.

## 2011-02-05 NOTE — Telephone Encounter (Signed)
Message copied by Clent Demark on Mon Feb 05, 2011  3:38 PM  ------       Message from: Sheran Luz       Created: Mon Feb 05, 2011 12:15 PM       Contact: [phone]         She left a message earlier, but would like to be called on this number.  [phone]

## 2011-02-06 NOTE — Telephone Encounter (Signed)
Patient is informed she has made a follow up apt. For tomorrow. Thanks

## 2011-02-06 NOTE — Telephone Encounter (Signed)
Ask patient to make appointment to see [place] in ASAP.   Thanks,  Woodroe Mode

## 2011-02-07 ENCOUNTER — Encounter (INDEPENDENT_AMBULATORY_CARE_PROVIDER_SITE_OTHER): Payer: Self-pay | Admitting: Internal Medicine

## 2011-02-07 ENCOUNTER — Ambulatory Visit (INDEPENDENT_AMBULATORY_CARE_PROVIDER_SITE_OTHER): Payer: BC Managed Care – PPO | Admitting: Internal Medicine

## 2011-02-07 VITALS — BP 112/60 | HR 87 | Temp 98.1°F | Resp 14 | Ht 64.0 in | Wt 113.4 lb

## 2011-02-07 DIAGNOSIS — N938 Other specified abnormal uterine and vaginal bleeding: Secondary | ICD-10-CM

## 2011-02-07 DIAGNOSIS — R1032 Left lower quadrant pain: Secondary | ICD-10-CM

## 2011-02-07 DIAGNOSIS — N926 Irregular menstruation, unspecified: Secondary | ICD-10-CM

## 2011-02-07 MED ORDER — CIPROFLOXACIN HCL 500 MG PO TABS
500.00 mg | ORAL_TABLET | Freq: Two times a day (BID) | ORAL | Status: AC
Start: 2011-02-07 — End: 2011-02-17

## 2011-02-07 MED ORDER — HYDROCODONE-ACETAMINOPHEN 5-500 MG PO TABS
1.00 | ORAL_TABLET | Freq: Three times a day (TID) | ORAL | Status: DC | PRN
Start: 2011-02-07 — End: 2011-03-14

## 2011-02-07 MED ORDER — METRONIDAZOLE 500 MG PO TABS
500.00 mg | ORAL_TABLET | Freq: Two times a day (BID) | ORAL | Status: AC
Start: 2011-02-07 — End: 2011-02-14

## 2011-02-07 NOTE — Progress Notes (Signed)
Subjective:       Patient ID: Sara Ball is a 35 y.o. female.  Chief Complaint   Patient presents with   . Abdominal Pain     x 3month   . Diabetes       Abdominal Pain  This is a recurrent problem. The current episode started 1 to 4 weeks ago. The onset quality is gradual. The problem occurs constantly. The problem has been unchanged. The pain is located in the LLQ. The pain is at a severity of 7/10. The pain is moderate. The quality of the pain is colicky and cramping. The abdominal pain does not radiate. Associated symptoms include headaches, nausea, vomiting and weight loss. Pertinent negatives include no anorexia, constipation, dysuria, frequency, hematochezia, hematuria or melena. The pain is aggravated by movement. The pain is relieved by nothing. She has tried acetaminophen and oral narcotic analgesics for the symptoms. The treatment provided mild relief. Prior diagnostic workup includes CT scan and ultrasound.   Diabetes  She has type 1 diabetes mellitus. Hypoglycemia symptoms include headaches, mood changes and nervousness/anxiousness. Pertinent negatives for hypoglycemia include no seizures. Associated symptoms include fatigue, weakness and weight loss. There are no hypoglycemic complications. Symptoms are stable. Diabetic complications include a CVA (TIA). Current diabetic treatment includes insulin injections and diet. She is compliant with treatment most of the time. Her weight is decreasing steadily. She is following a diabetic diet. She has had a previous visit with a dietician. She rarely participates in exercise. Her breakfast blood glucose is taken between 7-8 am. Her breakfast blood glucose range is generally 180-200 mg/dl. She sees a podiatrist.Eye exam is current.       She has appointment to see Gyn at [facility] for 02/27/11.  She has frequent menstrual periods.      The following portions of the patient's history were reviewed and updated as appropriate: allergies, current  medications, past family history, past medical history, past social history, past surgical history and problem list.    Review of Systems   Constitutional: Positive for weight loss and fatigue.   Gastrointestinal: Positive for nausea, vomiting and abdominal pain. Negative for constipation, blood in stool, melena, hematochezia and anorexia.   Genitourinary: Positive for vaginal bleeding and vaginal discharge. Negative for dysuria, frequency and hematuria.   Skin: Negative.    Neurological: Positive for weakness and headaches. Negative for seizures.   Psychiatric/Behavioral: Positive for disturbed wake/sleep cycle. The patient is nervous/anxious.    [all other systems reviewed and are negative            Objective:    Physical Exam   Constitutional: She is oriented to person, place, and time. She appears well-developed and well-nourished.   Cardiovascular: Normal rate, regular rhythm and normal heart sounds.    Pulmonary/Chest: Effort normal and breath sounds normal. No respiratory distress. She has no wheezes. She has no rales. She exhibits no tenderness.   Abdominal: Normal appearance and bowel sounds are normal. She exhibits no shifting dullness, no distension, no pulsatile liver, no fluid wave, no ascites and no mass. There is no hepatosplenomegaly. There is tenderness in the left lower quadrant. There is no rebound, no guarding, no CVA tenderness, no tenderness at McBurney's point and negative Murphy's sign. No hernia. Hernia confirmed negative in the ventral area.   Musculoskeletal: Normal range of motion. She exhibits no edema and no tenderness.   Neurological: She is alert and oriented to person, place, and time.   Skin: Skin  is warm and dry. She is not diaphoretic. No erythema.   Psychiatric: Her behavior is normal. Judgment and thought content normal. Her mood appears anxious. Cognition and memory are normal. She exhibits a depressed mood.     Filed Vitals:    02/07/11 0939   BP: 112/60   Pulse: 87   Temp:  98.1 F (36.7 C)   TempSrc: Temporal Artery   Resp: 14   Height: 1.626 m (5\' 4" )   Weight: 51.427 kg (113 lb 6 oz)   SpO2: 98%     C T abdomin report is reviewed. Ovarian cyst has resolved.      Assessment:       1. Abdominal pain, left lower quadrant  ciprofloxacin (CIPRO) 500 MG tablet, metroNIDAZOLE (FLAGYL) 500 MG tablet, HYDROcodone-acetaminophen (VICODIN) 5-500 MG per tablet   2. Dysfunctional uterine bleeding  ciprofloxacin (CIPRO) 500 MG tablet, metroNIDAZOLE (FLAGYL) 500 MG tablet, HYDROcodone-acetaminophen (VICODIN) 5-500 MG per tablet           Plan:       1. She was given Flagyl and Cipro antibiotics for possible intraabdominal or pelvic infection. She will continue Vicodin 5 mg Q 8 hrs prn for severe pain. # 60 X 0 is given. She will follow low fat high fibre ADA diet.  2. She will keep her Gyn appointment on 02/27/11. She has frequent uterine bleeding.   3. She will continue current Insulins. Blood sugars are improving to below 200 range.

## 2011-02-12 ENCOUNTER — Ambulatory Visit (INDEPENDENT_AMBULATORY_CARE_PROVIDER_SITE_OTHER): Payer: BC Managed Care – PPO | Admitting: Internal Medicine

## 2011-02-21 ENCOUNTER — Telehealth (INDEPENDENT_AMBULATORY_CARE_PROVIDER_SITE_OTHER): Payer: Self-pay

## 2011-02-21 ENCOUNTER — Other Ambulatory Visit (INDEPENDENT_AMBULATORY_CARE_PROVIDER_SITE_OTHER): Payer: Self-pay | Admitting: Internal Medicine

## 2011-02-21 NOTE — Telephone Encounter (Signed)
Pt called to request a refill of Breeze test strips sent to [facility] [address].  Pt will be in for rx refill tomorrow 02/22/2011 but is out of test strips.

## 2011-02-22 ENCOUNTER — Ambulatory Visit (INDEPENDENT_AMBULATORY_CARE_PROVIDER_SITE_OTHER): Payer: BC Managed Care – PPO | Admitting: Internal Medicine

## 2011-02-22 ENCOUNTER — Encounter (INDEPENDENT_AMBULATORY_CARE_PROVIDER_SITE_OTHER): Payer: Self-pay | Admitting: Internal Medicine

## 2011-02-22 VITALS — BP 116/76 | HR 76 | Temp 97.6°F | Resp 16 | Wt 112.0 lb

## 2011-02-22 DIAGNOSIS — K648 Other hemorrhoids: Secondary | ICD-10-CM

## 2011-02-22 DIAGNOSIS — F410 Panic disorder [episodic paroxysmal anxiety] without agoraphobia: Secondary | ICD-10-CM

## 2011-02-22 MED ORDER — CLONAZEPAM 1 MG PO TABS
1.00 mg | ORAL_TABLET | Freq: Every day | ORAL | Status: DC
Start: 2011-02-22 — End: 2011-03-14

## 2011-02-22 MED ORDER — HYDROCORTISONE ACETATE 25 MG RE SUPP
25.00 mg | Freq: Two times a day (BID) | RECTAL | Status: AC
Start: 2011-02-22 — End: 2011-03-04

## 2011-02-22 MED ORDER — ALPRAZOLAM 0.5 MG PO TABS
0.50 mg | ORAL_TABLET | Freq: Two times a day (BID) | ORAL | Status: DC | PRN
Start: 2011-02-22 — End: 2011-03-14

## 2011-02-22 NOTE — Progress Notes (Signed)
Subjective:       Patient ID: Sara Ball is a 35 y.o. female.  Chief Complaint   Patient presents with   . Anxiety     Medication refill.   . Hemorrhoids       Anxiety  Presents for follow-up visit. Symptoms include decreased concentration, dry mouth, insomnia, irritability, nervous/anxious behavior and panic. Symptoms occur most days. The severity of symptoms is moderate. Nighttime awakenings: one to two.     Compliance with medications is 51-75%.     She needs refill on anxiety medicines.  Hemorrhoids : She has pain in anal area for few weeks. She has itching in this area. She occasionally has bleeding.     The following portions of the patient's history were reviewed and updated as appropriate: allergies, current medications, past family history, past medical history, past social history, past surgical history and problem list.    Review of Systems   Constitutional: Positive for irritability and fatigue. Negative for unexpected weight change.   Gastrointestinal: Positive for anal bleeding and rectal pain.        [Perianal itching and pain  Genitourinary: Negative.    Psychiatric/Behavioral: Positive for disturbed wake/sleep cycle and decreased concentration. The patient is nervous/anxious and has insomnia.    [all other systems reviewed and are negative            Objective:    Physical Exam   Constitutional: She appears well-developed and well-nourished.   Abdominal: Soft. Normal appearance and bowel sounds are normal. She exhibits mass. There is no hepatosplenomegaly. There is no tenderness. There is no rebound and no CVA tenderness. No hernia. Hernia confirmed negative in the ventral area, confirmed negative in the right inguinal area and confirmed negative in the left inguinal area.   Genitourinary: Vagina normal. Rectal exam shows internal hemorrhoid, tenderness and anal tone abnormal. Rectal exam shows no fissure and no mass. Guaiac positive stool. There is no rash or tenderness on the right labia.  There is no tenderness on the left labia.   Lymphadenopathy:        Right: No inguinal adenopathy present.        Left: No inguinal adenopathy present.   Skin: Skin is warm and dry. Rash noted. There is erythema (perianal area). No pallor.   Psychiatric: Her speech is normal and behavior is normal. Judgment and thought content normal. Her mood appears anxious. Cognition and memory are normal.     Filed Vitals:    02/22/11 0854   BP: 116/76   Pulse: 76   Temp: 97.6 F (36.4 C)   TempSrc: Temporal Artery   Resp: 16   Weight: 50.803 kg (112 lb)   SpO2: 100%     Stool hemoccult : Negative.        Assessment:       1. Panic disorder without agoraphobia  clonAZEpam (KLONOPIN) 1 MG tablet, ALPRAZolam (XANAX) 0.5 MG tablet   2. Bleeding internal hemorrhoids  hydrocortisone (ANUSOL-HC) 25 MG suppository           Plan:       1. She was given refill on Klonopin and Xanax as above. She was explained side effects of sedatives.   2. She was given Anusol-HC ractal suppository BID for 2 weeks. She will keep stools soft by taking high fiber diet and stool softner, if needed.

## 2011-03-05 ENCOUNTER — Ambulatory Visit
Admit: 2011-03-05 | Discharge: 2011-03-05 | Disposition: A | Discharge status: Home / Self Care | Payer: Self-pay | Source: Ambulatory Visit

## 2011-03-05 ENCOUNTER — Ambulatory Visit
Admission: RE | Admit: 2011-03-05 | Disposition: A | Discharge status: Home / Self Care | Payer: Self-pay | Source: Ambulatory Visit | Attending: Obstetrics and Gynecology | Admitting: Obstetrics and Gynecology

## 2011-03-14 ENCOUNTER — Encounter (INDEPENDENT_AMBULATORY_CARE_PROVIDER_SITE_OTHER): Payer: Self-pay | Admitting: Internal Medicine

## 2011-03-14 ENCOUNTER — Ambulatory Visit (INDEPENDENT_AMBULATORY_CARE_PROVIDER_SITE_OTHER): Payer: BC Managed Care – PPO | Admitting: Internal Medicine

## 2011-03-14 VITALS — BP 132/88 | HR 92 | Temp 97.8°F | Resp 18 | Ht 66.0 in | Wt 117.0 lb

## 2011-03-14 DIAGNOSIS — F410 Panic disorder [episodic paroxysmal anxiety] without agoraphobia: Secondary | ICD-10-CM

## 2011-03-14 DIAGNOSIS — N83299 Other ovarian cyst, unspecified side: Secondary | ICD-10-CM

## 2011-03-14 DIAGNOSIS — N83209 Unspecified ovarian cyst, unspecified side: Secondary | ICD-10-CM

## 2011-03-14 DIAGNOSIS — R1032 Left lower quadrant pain: Secondary | ICD-10-CM

## 2011-03-14 MED ORDER — TRAMADOL HCL 50 MG PO TABS
50.00 mg | ORAL_TABLET | Freq: Three times a day (TID) | ORAL | Status: AC | PRN
Start: 2011-03-14 — End: 2011-03-24

## 2011-03-14 MED ORDER — CLONAZEPAM 1 MG PO TABS
1.00 mg | ORAL_TABLET | Freq: Every day | ORAL | Status: DC
Start: 2011-03-14 — End: 2011-09-06

## 2011-03-14 NOTE — Progress Notes (Signed)
Subjective:       Patient ID: Sara Ball is a 35 y.o. female.  Chief Complaint   Patient presents with   . Abdominal Pain   . Anxiety       Abdominal Pain  This is a recurrent problem. The current episode started more than 1 month ago. The onset quality is gradual. The problem occurs intermittently. The problem has been unchanged. The pain is located in the LLQ. The pain is at a severity of 6/10. The pain is moderate. The quality of the pain is aching and colicky. The abdominal pain does not radiate. Associated symptoms include nausea. Pertinent negatives include no anorexia, belching, constipation, diarrhea, dysuria, fever, vomiting or weight loss. Nothing aggravates the pain. The pain is relieved by nothing. She has tried acetaminophen and oral narcotic analgesics for the symptoms. The treatment provided moderate relief. Prior diagnostic workup includes CT scan and ultrasound. There is no history of abdominal surgery, gallstones or pancreatitis.   Anxiety  Presents for follow-up visit. Symptoms include decreased concentration, insomnia, nausea, nervous/anxious behavior, palpitations and panic. Symptoms occur most days. The severity of symptoms is severe. The quality of sleep is fair.     Compliance with medications is 76-100%.       The following portions of the patient's history were reviewed and updated as appropriate: allergies, current medications, past family history, past medical history, past social history, past surgical history and problem list.    Review of Systems   Constitutional: Negative for fever and weight loss.   Eyes: Negative.    Respiratory: Negative.    Cardiovascular: Positive for palpitations.   Gastrointestinal: Positive for nausea and abdominal pain. Negative for vomiting, diarrhea, constipation, blood in stool, anal bleeding, rectal pain and anorexia.   Genitourinary: Positive for menstrual problem (frequant M.P. ) and pelvic pain. Negative for dysuria.   Psychiatric/Behavioral:  Positive for disturbed wake/sleep cycle and decreased concentration. The patient is nervous/anxious and has insomnia.    [all other systems reviewed and are negative            Objective:    Physical Exam   Constitutional: She appears well-developed and well-nourished.   Cardiovascular: Normal rate, regular rhythm and normal heart sounds.    Pulmonary/Chest: No respiratory distress. She has no wheezes. She has no rales. She exhibits no tenderness.   Abdominal: Soft. Bowel sounds are normal. She exhibits no distension and no mass. There is tenderness (LLQ Moderate.). There is no rebound and no guarding.        No HSM   Musculoskeletal: Normal range of motion. She exhibits no edema.   Skin: Skin is warm and dry. She is not diaphoretic. No erythema.   Psychiatric: Her speech is normal and behavior is normal. Judgment and thought content normal. Her mood appears anxious. Cognition and memory are normal.     Blood pressure 132/88, pulse 92, temperature 97.8 F (36.6 C), temperature source Temporal Artery, resp. rate 18, height 1.676 m (5\' 6" ), weight 53.071 kg (117 lb), last menstrual period 03/13/2011, SpO2 98.00%.        Assessment:       1. Abdominal pain, left lower quadrant  traMADOL (ULTRAM) 50 MG tablet   2. Panic disorder without agoraphobia  clonAZEpam (KLONOPIN) 1 MG tablet   3. Left Ovarian cyst, complex             Plan:       1. She was given ultram 50 mg TID prn. Follow  diabetic low fat diet.   2. She was advised to stop Xanax now. She was given Klonopin 1 mg Q hs prn # 30 X 0. She should consider SSRI.  3. She will continue next appointment in 2 days with Gyn.

## 2011-04-04 ENCOUNTER — Ambulatory Visit (INDEPENDENT_AMBULATORY_CARE_PROVIDER_SITE_OTHER): Payer: BC Managed Care – PPO | Admitting: Internal Medicine

## 2011-04-04 ENCOUNTER — Encounter (INDEPENDENT_AMBULATORY_CARE_PROVIDER_SITE_OTHER): Payer: Self-pay | Admitting: Internal Medicine

## 2011-04-04 VITALS — BP 120/80 | HR 104 | Temp 97.0°F | Resp 12 | Ht 64.0 in | Wt 115.0 lb

## 2011-04-04 DIAGNOSIS — J029 Acute pharyngitis, unspecified: Secondary | ICD-10-CM

## 2011-04-04 LAB — POCT RAPID STREP A

## 2011-04-04 MED ORDER — AZITHROMYCIN 250 MG PO TABS
ORAL_TABLET | ORAL | Status: AC
Start: 2011-04-04 — End: 2011-04-09

## 2011-04-04 NOTE — Progress Notes (Signed)
Subjective:       Patient ID: Sara Ball is a 36 y.o. female.  Chief Complaint   Patient presents with   . Sore Throat   . Sinusitis   . Headache   . Cough     Sore Throat   This is a new problem. Episode onset: 3 days ago. The problem has been gradually worsening. Neither side of throat is experiencing more pain than the other. The maximum temperature recorded prior to her arrival was 100 - 100.9 F. The fever has been present for less than 1 day. The pain is mild. Associated symptoms include congestion, coughing and headaches. Pertinent negatives include no shortness of breath or vomiting. She has had exposure to strep. She has tried acetaminophen for the symptoms. The treatment provided mild relief.   Headache   This is a new problem. The current episode started yesterday. The problem has been waxing and waning. The pain is located in the bilateral region. The pain does not radiate. The quality of the pain is described as aching. The pain is at a severity of 4/10. The pain is mild. Associated symptoms include coughing, drainage, a fever, muscle aches, rhinorrhea and a sore throat. Pertinent negatives include no blurred vision or vomiting. Nothing aggravates the symptoms. She has tried acetaminophen for the symptoms. The treatment provided mild relief.   Cough  This is a new problem. Episode onset: 3 days. The problem has been gradually worsening. The cough is non-productive. Associated symptoms include a fever, headaches, postnasal drip, rhinorrhea and a sore throat. Pertinent negatives include no shortness of breath. Nothing aggravates the symptoms. She has tried nothing for the symptoms.       The following portions of the patient's history were reviewed and updated as appropriate: allergies, current medications, past family history, past medical history, past social history, past surgical history and problem list.    Review of Systems   Constitutional: Positive for fever.   HENT: Positive for congestion,  sore throat, rhinorrhea, sneezing and postnasal drip. Negative for voice change.    Eyes: Negative for blurred vision.   Respiratory: Positive for cough. Negative for shortness of breath.    Cardiovascular: Negative.    Gastrointestinal: Negative for vomiting.   Neurological: Positive for headaches.   [all other systems reviewed and are negative            Objective:    Physical Exam   Constitutional: She appears well-developed and well-nourished.   HENT:   Head: Normocephalic.   Right Ear: External ear normal.   Left Ear: External ear normal.   Nose: Nose normal.   Mouth/Throat: Oropharyngeal exudate present.   Eyes: Conjunctivae and EOM are normal. Pupils are equal, round, and reactive to light.   Neck: Normal range of motion. Neck supple.   Cardiovascular: Normal rate, regular rhythm and normal heart sounds.    Pulmonary/Chest: Effort normal and breath sounds normal. No respiratory distress. She has no wheezes. She has no rales. She exhibits no tenderness.     Blood pressure 120/80, pulse 104, temperature 97 F (36.1 C), temperature source Temporal Artery, resp. rate 12, height 1.626 m (5\' 4" ), weight 52.164 kg (115 lb), last menstrual period 03/13/2011, SpO2 100.00%.    POCT Strep : Negative.  Assessment:       1. Acute pharyngitis  POCT rapid strep A, azithromycin (ZITHROMAX Z-PAK) 250 MG tablet           Plan:  She was given Z Pak. I advised patient to rest and drink plenty of fluids. Take prescribed medicine as explained. You may take OTC Tylenol 500 mg Q 4 hours as needed for pain or fever. You may take OTC Mucinex DM 1 pill twice a day as needed for dry cough. You may take OTC Claratin 10 mg once a day for allergies, sneezing or runny nose.  Return in 1 week, if you are not better.

## 2011-09-06 ENCOUNTER — Encounter (INDEPENDENT_AMBULATORY_CARE_PROVIDER_SITE_OTHER): Payer: Self-pay | Admitting: Internal Medicine

## 2011-09-06 ENCOUNTER — Ambulatory Visit (INDEPENDENT_AMBULATORY_CARE_PROVIDER_SITE_OTHER): Payer: Exclusive Provider Organization | Admitting: Internal Medicine

## 2011-09-06 VITALS — BP 112/66 | HR 90 | Temp 97.9°F | Resp 14 | Ht 64.0 in | Wt 119.2 lb

## 2011-09-06 DIAGNOSIS — J209 Acute bronchitis, unspecified: Secondary | ICD-10-CM

## 2011-09-06 MED ORDER — PROMETHAZINE-CODEINE 6.25-10 MG/5ML PO SYRP
5.00 mL | ORAL_SOLUTION | Freq: Four times a day (QID) | ORAL | Status: AC | PRN
Start: 2011-09-06 — End: 2011-09-16

## 2011-09-06 MED ORDER — AZITHROMYCIN 250 MG PO TABS
ORAL_TABLET | ORAL | Status: AC
Start: 2011-09-06 — End: 2011-09-11

## 2011-09-06 NOTE — Progress Notes (Signed)
1.  Over the last two weeks, have you been bothered by feeling  down, depressed, or hopeless?  No    2.  Over the last two weeks, have you been bothered by little  interest or pleasure in doing things? No        Scoring:    Not at all: 0  Several days: 1  More than half the days:2  Nearly every day: 3      ONLY IF A 3 IS SCORED ON EITHER OF THE 2 QUESTIONS, THEN GIVE OUT PHQ9  1. Have you self referred yourself since we last saw you?    Refer to care team   Or  Add specialists:    No

## 2011-09-06 NOTE — Progress Notes (Signed)
Subjective:       Patient ID: Sara Ball is a 36 y.o. female.  Chief Complaint   Patient presents with   . Sore Throat   . Cough       Sore Throat   This is a new problem. The current episode started in the past 7 days. The problem has been gradually worsening. Neither side of throat is experiencing more pain than the other. The maximum temperature recorded prior to her arrival was 100 - 100.9 F. The fever has been present for 1 to 2 days. The pain is moderate. Associated symptoms include coughing, a hoarse voice, neck pain and shortness of breath. Pertinent negatives include no stridor or trouble swallowing. She has tried nothing for the symptoms.   Cough  This is a new problem. The current episode started in the past 7 days. The problem has been gradually worsening. The problem occurs every few hours. The cough is productive of sputum. Associated symptoms include a fever and shortness of breath. Pertinent negatives include no chest pain or wheezing. Nothing aggravates the symptoms. She has tried nothing for the symptoms. There is no history of asthma or COPD.       The following portions of the patient's history were reviewed and updated as appropriate: allergies, current medications, past family history, past medical history, past social history, past surgical history and problem list.    Review of Systems   Constitutional: Positive for fever and fatigue. Negative for unexpected weight change.   HENT: Positive for hoarse voice and neck pain. Negative for trouble swallowing.    Eyes: Negative.    Respiratory: Positive for cough and shortness of breath. Negative for wheezing and stridor.    Cardiovascular: Negative for chest pain, palpitations and leg swelling.   All other systems reviewed and are negative.            Objective:    Physical Exam   Constitutional: She appears well-developed and well-nourished.   HENT:   Head: Normocephalic and atraumatic.   Right Ear: Tympanic membrane, external ear and ear  canal normal.   Left Ear: Tympanic membrane, external ear and ear canal normal.   Nose: Rhinorrhea present. Right sinus exhibits maxillary sinus tenderness. Left sinus exhibits maxillary sinus tenderness.   Mouth/Throat: Uvula is midline and mucous membranes are normal. Oropharyngeal exudate present.   Eyes: Conjunctivae normal and EOM are normal. Pupils are equal, round, and reactive to light.   Neck: Normal range of motion. Neck supple. No thyromegaly present.   Cardiovascular: Normal rate, regular rhythm, normal heart sounds and intact distal pulses.    Pulmonary/Chest: Effort normal. She has no decreased breath sounds. She has no wheezes. She has rhonchi in the right lower field and the left lower field. She has no rales.   Lymphadenopathy:     She has no cervical adenopathy.     Filed Vitals:    09/06/11 1127   BP: 112/66   Pulse: 90   Temp: 97.9 F (36.6 C)   TempSrc: Tympanic   Resp: 14   Height: 1.626 m (5\' 4" )   Weight: 54.091 kg (119 lb 4 oz)   SpO2: 98%           Assessment:       1. Acute bronchitis  azithromycin (ZITHROMAX Z-PAK) 250 MG tablet, promethazine-codeine (PHENERGAN WITH CODEINE) 6.25-10 MG/5ML syrup           Plan:       She was given  Z Pak and Phenergan ith codeine cough syrup. I advised patient to rest and drink plenty of fluids. Take prescribed medicine as explained. You may take OTC Tylenol 500 mg Q 4 hours as needed for pain or fever. You may take OTC Mucinex DM 1 pill twice a day as needed for dry cough. You may take OTC Claritin 10 mg once a day for allergies, sneezing or runny nose.  Return in 1 week, if you are not better.

## 2011-09-17 ENCOUNTER — Emergency Department: Payer: Exclusive Provider Organization

## 2011-09-17 ENCOUNTER — Emergency Department: Payer: Self-pay

## 2011-09-17 ENCOUNTER — Emergency Department
Admission: EM | Admit: 2011-09-17 | Discharge: 2011-09-17 | Disposition: A | Discharge status: Home / Self Care | Payer: Exclusive Provider Organization

## 2011-09-17 DIAGNOSIS — S139XXA Sprain of joints and ligaments of unspecified parts of neck, initial encounter: Secondary | ICD-10-CM | POA: Insufficient documentation

## 2011-09-17 DIAGNOSIS — S39012A Strain of muscle, fascia and tendon of lower back, initial encounter: Secondary | ICD-10-CM

## 2011-09-17 DIAGNOSIS — S335XXA Sprain of ligaments of lumbar spine, initial encounter: Secondary | ICD-10-CM | POA: Insufficient documentation

## 2011-09-17 DIAGNOSIS — E109 Type 1 diabetes mellitus without complications: Secondary | ICD-10-CM | POA: Insufficient documentation

## 2011-09-17 DIAGNOSIS — S161XXA Strain of muscle, fascia and tendon at neck level, initial encounter: Secondary | ICD-10-CM

## 2011-09-17 DIAGNOSIS — S8010XA Contusion of unspecified lower leg, initial encounter: Secondary | ICD-10-CM | POA: Insufficient documentation

## 2011-09-17 DIAGNOSIS — F17200 Nicotine dependence, unspecified, uncomplicated: Secondary | ICD-10-CM | POA: Insufficient documentation

## 2011-09-17 DIAGNOSIS — Z794 Long term (current) use of insulin: Secondary | ICD-10-CM | POA: Insufficient documentation

## 2011-09-17 LAB — POCT GLUCOSE: Whole Blood Glucose POCT: 134 mg/dL — AB (ref 70–100)

## 2011-09-17 MED ORDER — OXYCODONE-ACETAMINOPHEN 5-325 MG PO TABS
ORAL_TABLET | ORAL | Status: DC
Start: 2011-09-17 — End: 2011-09-26

## 2011-09-17 MED ORDER — OXYCODONE-ACETAMINOPHEN 5-325 MG PO TABS
1.00 | ORAL_TABLET | Freq: Once | ORAL | Status: DC
Start: 2011-09-17 — End: 2011-09-17

## 2011-09-17 MED ORDER — OXYCODONE-ACETAMINOPHEN 5-325 MG PO TABS
1.00 | ORAL_TABLET | Freq: Once | ORAL | Status: AC
Start: 2011-09-17 — End: 2011-09-17
  Administered 2011-09-17: 1 via ORAL
  Filled 2011-09-17: qty 1
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
## 2011-09-17 NOTE — Discharge Instructions (Signed)
Follow up with your Dr. Alfredo Bach in 3-4 days.        Thank you for choosing [facility] for your emergency care needs.  We strive to provide EXCELLENT care to you and your family.      If you do not continue to improve or your condition worsens, please contact your doctor or return immediately to the Emergency Department.    DOCTOR REFERRALS  Call [phone] if you need any further referrals and we can help you find a primary care doctor or specialist.  Also, available online at:  [email or web address]    YOUR CONTACT INFORMATION  Before leaving please check with registration to make sure we have an up-to-date contact number.  You can call registration at [phone] to update your information.  For questions about your hospital bill, please call [phone].  For questions about your Emergency Dept Physician bill please call [phone].      FREE HEALTH SERVICES  If you need help with health or social services, please call 2-1-1 for a free referral to resources in your area.  2-1-1 is a free service connecting people with information on health insurance, free clinics, pregnancy, mental health, dental care, food assistance, housing, and substance abuse counseling.  Also, available online at:  http://[email or web address].org    MEDICAL RECORDS AND TESTS  Certain laboratory test results do not come back the same day, for example urine cultures.   We will contact you if other important findings are noted.  Radiology films are often reviewed again to ensure accuracy.  If there is any discrepancy, we will notify you.      Please call [phone] to pick up a complimentary CD of any radiology studies performed.  If you or your doctor would like to request a copy of your medical records, please call [phone].      ORTHOPEDIC INJURY   Please know that significant injuries can exist even when an initial x-ray is read as normal or negative.  This can occur because some fractures  (broken bones) are not initially visible on x-rays.  For this reason, close outpatient follow-up with your primary care doctor or bone specialist (orthopedist) is required.    MEDICATIONS AND FOLLOWUP  Please be aware that some prescription medications can cause drowsiness.  Use caution when driving or operating machinery.    The examination and treatment you have received in our Emergency Department is provided on an emergency basis, and is not intended to be a substitute for your primary care physician.  It is important that your doctor checks you again and that you report any new or remaining problems at that time.
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
## 2011-09-26 ENCOUNTER — Encounter (INDEPENDENT_AMBULATORY_CARE_PROVIDER_SITE_OTHER): Payer: Self-pay | Admitting: Internal Medicine

## 2011-09-26 ENCOUNTER — Ambulatory Visit (INDEPENDENT_AMBULATORY_CARE_PROVIDER_SITE_OTHER): Payer: Exclusive Provider Organization | Admitting: Internal Medicine

## 2011-09-26 VITALS — BP 143/80 | HR 88 | Temp 96.8°F | Resp 12 | Ht 64.0 in | Wt 118.0 lb

## 2011-09-26 DIAGNOSIS — S139XXA Sprain of joints and ligaments of unspecified parts of neck, initial encounter: Secondary | ICD-10-CM

## 2011-09-26 DIAGNOSIS — S8010XA Contusion of unspecified lower leg, initial encounter: Secondary | ICD-10-CM

## 2011-09-26 DIAGNOSIS — S335XXA Sprain of ligaments of lumbar spine, initial encounter: Secondary | ICD-10-CM

## 2011-09-26 MED ORDER — CYCLOBENZAPRINE HCL 10 MG PO TABS
10.00 mg | ORAL_TABLET | Freq: Every evening | ORAL | Status: AC | PRN
Start: 2011-09-26 — End: 2012-09-25

## 2011-09-26 MED ORDER — HYDROCODONE-ACETAMINOPHEN 5-500 MG PO TABS
1.00 | ORAL_TABLET | Freq: Three times a day (TID) | ORAL | Status: AC | PRN
Start: 2011-09-26 — End: 2012-09-25
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
## 2011-10-08 ENCOUNTER — Telehealth (INDEPENDENT_AMBULATORY_CARE_PROVIDER_SITE_OTHER): Payer: Self-pay | Admitting: Internal Medicine

## 2011-10-08 ENCOUNTER — Telehealth (INDEPENDENT_AMBULATORY_CARE_PROVIDER_SITE_OTHER): Payer: Self-pay

## 2011-10-08 NOTE — Telephone Encounter (Signed)
Call placed to patient at number documented above.  LVMM with RN Navigator contact information and request for return call.  Follow up call scheduled for week of 10/15/11 if patient does not return call before then.  RN Navigator to continue to follow.

## 2011-10-08 NOTE — Telephone Encounter (Signed)
insulin aspart (NOVOLOG) 100 UNIT/ML injection  insulin glargine (LANTUS) 100 UNIT/ML injection   Insulin Syringe-Needle U-100 (B-D INS SYRINGE 0.5CC/31GX5/16) 31G X 5/16" 0.5 ML MISC    [id number] - [place], [place] - [address] AT [facility] [address] [phone] (Phone)  [phone] (Fax)

## 2011-10-09 ENCOUNTER — Other Ambulatory Visit (INDEPENDENT_AMBULATORY_CARE_PROVIDER_SITE_OTHER): Payer: Self-pay | Admitting: Internal Medicine

## 2011-10-09 DIAGNOSIS — IMO0002 Reserved for concepts with insufficient information to code with codable children: Secondary | ICD-10-CM

## 2011-10-09 MED ORDER — INSULIN ASPART 100 UNIT/ML SC SOLN
SUBCUTANEOUS | Status: DC
Start: 2011-10-09 — End: 2011-10-10

## 2011-10-09 MED ORDER — INSULIN GLARGINE 100 UNIT/ML SC SOLN
25.00 [IU] | Freq: Every day | SUBCUTANEOUS | Status: DC
Start: 2011-10-09 — End: 2011-10-10

## 2011-10-09 MED ORDER — INSULIN SYRINGE-NEEDLE U-100 31G X 5/16" 0.5 ML MISC
36.00 [IU] | Freq: Every day | Status: DC
Start: 2011-10-09 — End: 2011-10-10

## 2011-10-09 NOTE — Telephone Encounter (Signed)
Refill encounter sent to md

## 2011-10-10 ENCOUNTER — Other Ambulatory Visit (INDEPENDENT_AMBULATORY_CARE_PROVIDER_SITE_OTHER): Payer: Self-pay | Admitting: Internal Medicine

## 2011-10-10 DIAGNOSIS — E109 Type 1 diabetes mellitus without complications: Secondary | ICD-10-CM

## 2011-10-10 MED ORDER — PEN NEEDLES 1/2" 29G X 12MM MISC
Status: AC
Start: 2011-10-10 — End: ?

## 2011-10-10 MED ORDER — INSULIN GLARGINE 100 UNIT/ML SC SOLN
25.00 [IU] | Freq: Every evening | SUBCUTANEOUS | Status: AC
Start: 2011-10-10 — End: 2012-10-09

## 2011-10-10 MED ORDER — INSULIN ASPART 100 UNIT/ML SC SOLN
4.00 [IU] | Freq: Three times a day (TID) | SUBCUTANEOUS | Status: AC
Start: 2011-10-10 — End: 2012-10-09

## 2011-10-18 ENCOUNTER — Encounter (INDEPENDENT_AMBULATORY_CARE_PROVIDER_SITE_OTHER): Payer: Self-pay

## 2011-10-18 NOTE — Progress Notes (Signed)
RN Navigator sent patient email via MyChart to introduce the [facility].  Information packet mailed to patient which included;      RN Navigator introduction letter    [facility] information    DM2 head to toe    Diabetes and shopping    Diabetes and You book    BG logs    Exercise info    Healthy Recipes.    RN Navigator to continue to follow and next call scheduled for week of 10/29/11.

## 2011-10-29 ENCOUNTER — Telehealth (INDEPENDENT_AMBULATORY_CARE_PROVIDER_SITE_OTHER): Payer: Self-pay

## 2011-10-29 NOTE — Telephone Encounter (Signed)
RN Navigator placed call to patient at contact number documented above; no answer; LVMM with contact information and request for return call.  Will schedule call the week of 11/12/11 to f/u.

## 2011-11-12 ENCOUNTER — Telehealth (INDEPENDENT_AMBULATORY_CARE_PROVIDER_SITE_OTHER): Payer: Self-pay

## 2011-11-12 NOTE — Telephone Encounter (Signed)
Call placed to patient at number documented above.  LVMM with RN Navigator contact information and request for return call.  Follow up call scheduled for week of 11/26/11  if patient does not return call before then.  RN Navigator to continue to follow.

## 2011-11-27 ENCOUNTER — Telehealth (INDEPENDENT_AMBULATORY_CARE_PROVIDER_SITE_OTHER): Payer: Self-pay

## 2011-11-27 NOTE — Telephone Encounter (Addendum)
RN Navigator placed call to patient to contact number documented above;  No answer; LVMM with contact information and request for return call.  Await return call from patient or re-referal from PCP.

## 2011-12-10 ENCOUNTER — Telehealth (INDEPENDENT_AMBULATORY_CARE_PROVIDER_SITE_OTHER): Payer: Self-pay | Admitting: Internal Medicine

## 2011-12-10 MED ORDER — AZITHROMYCIN 250 MG PO TABS
ORAL_TABLET | ORAL | Status: AC
Start: 2011-12-10 — End: 2011-12-15

## 2011-12-10 NOTE — Telephone Encounter (Signed)
Wants rx for zpak for sinus infection   [facility] - [address]   Pt phone [phone]

## 2011-12-10 NOTE — Telephone Encounter (Signed)
Sent, pt informed. 

## 2011-12-21 ENCOUNTER — Ambulatory Visit (INDEPENDENT_AMBULATORY_CARE_PROVIDER_SITE_OTHER): Payer: Exclusive Provider Organization | Admitting: Internal Medicine

## 2011-12-21 ENCOUNTER — Encounter (INDEPENDENT_AMBULATORY_CARE_PROVIDER_SITE_OTHER): Payer: Self-pay | Admitting: Internal Medicine

## 2011-12-21 VITALS — BP 120/84 | HR 95 | Temp 97.8°F | Resp 12 | Ht 64.0 in | Wt 116.0 lb

## 2011-12-21 MED ORDER — NORTRIPTYLINE HCL 10 MG PO CAPS
10.00 mg | ORAL_CAPSULE | Freq: Every evening | ORAL | Status: AC
Start: 2011-12-21 — End: 2012-12-20

## 2011-12-21 NOTE — Progress Notes (Signed)
Subjective:       Patient ID: Sara Ball is a 36 y.o. female.  Chief Complaint   Patient presents with   . Headache     on and off  over 2 weeks -- occasional blurry vision    . Diabetes   . Hyperlipidemia       Headache   This is a recurrent problem. The current episode started more than 1 year ago (worse for 2 weeks). The problem occurs intermittently. The problem has been waxing and waning. The pain is located in the bilateral, retro-orbital and frontal region. The pain does not radiate. The pain quality is similar to prior headaches. The quality of the pain is described as aching, boring and throbbing. The pain is at a severity of 7/10. The pain is moderate. Associated symptoms include blurred vision, eye pain, eye watering, photophobia, scalp tenderness and sinus pressure. Pertinent negatives include no dizziness, ear pain, fever, numbness, phonophobia, seizures, sore throat, weakness or weight loss. The symptoms are aggravated by bright light. She has tried acetaminophen for the symptoms. The treatment provided no relief. Her past medical history is significant for migraine headaches.   Diabetes  She presents for her follow-up diabetic visit. She has type 1 diabetes mellitus. Her disease course has been stable. Hypoglycemia symptoms include headaches. Pertinent negatives for hypoglycemia include no dizziness, seizures, sleepiness, speech difficulty or tremors. Associated symptoms include blurred vision. Pertinent negatives for diabetes include no fatigue, no polydipsia, no polyphagia, no weakness and no weight loss. There are no hypoglycemic complications. There are no diabetic complications. Risk factors for coronary artery disease include dyslipidemia. Current diabetic treatment includes insulin injections and diet. She is compliant with treatment some of the time. Her weight is stable. She is following a generally healthy diet. Home blood sugar record trend: claims that it is OK. She sees a  podiatrist.Eye exam is current.   Hyperlipidemia  This is a chronic problem. The current episode started more than 1 year ago. The problem is uncontrolled. Exacerbating diseases include diabetes. She has no history of hypothyroidism or liver disease. There are no known factors aggravating her hyperlipidemia. Pertinent negatives include no focal sensory loss or myalgias. She is currently on no antihyperlipidemic treatment.     She went [facility]. She reveals that she  Had Hb A1C test there. She will fax report to me.     The following portions of the patient's history were reviewed and updated as appropriate: allergies, current medications, past family history, past medical history, past social history, past surgical history and problem list.    Review of Systems   Constitutional: Negative for fever, weight loss, fatigue and unexpected weight change.   HENT: Positive for sinus pressure. Negative for ear pain and sore throat.    Eyes: Positive for blurred vision, photophobia and pain.   Genitourinary: Negative.    Musculoskeletal: Negative.  Negative for myalgias.   Neurological: Positive for headaches. Negative for dizziness, tremors, seizures, facial asymmetry, speech difficulty, weakness, light-headedness and numbness.   Hematological: Negative.  Negative for polydipsia and polyphagia.   Psychiatric/Behavioral: Negative.    All other systems reviewed and are negative.            Objective:    Physical Exam   Vitals reviewed.  Constitutional: She is oriented to person, place, and time. She appears well-developed and well-nourished.   HENT:   Head: Normocephalic and atraumatic.   Right Ear: External ear  normal.   Left Ear: External ear normal.   Nose: Nose normal.   Mouth/Throat: Oropharynx is clear and moist.   Eyes: Conjunctivae normal and EOM are normal. Pupils are equal, round, and reactive to light.   Neck: Normal range of motion. Neck supple. No JVD present. No tracheal deviation  present. No thyromegaly present.   Cardiovascular: Normal rate, regular rhythm, normal heart sounds and intact distal pulses.    Pulmonary/Chest: Effort normal and breath sounds normal. No respiratory distress. She has no wheezes. She has no rales.   Abdominal: Bowel sounds are normal. There is no tenderness.   Musculoskeletal: Normal range of motion. She exhibits no edema and no tenderness.   Lymphadenopathy:     She has no cervical adenopathy.   Neurological: She is alert and oriented to person, place, and time. She has normal reflexes. She displays normal reflexes. No cranial nerve deficit.   Psychiatric: She has a normal mood and affect. Her behavior is normal. Judgment and thought content normal.     Filed Vitals:    12/21/11 1450   BP: 120/84   Pulse: 95   Temp: 97.8 F (36.6 C)   TempSrc: Tympanic   Resp: 12   Height: 1.626 m (5\' 4" )   Weight: 52.617 kg (116 lb)   SpO2: 99%           Assessment:       1. Classic migraine  nortriptyline (PAMELOR) 10 MG capsule   2. Type I (juvenile type) diabetes mellitus without mention of complication, not stated as uncontrolled     3. Mixed hyperlipidemia             Plan:       1. She was explained migraine prophylaxis. We will try to avoid Beta blocker due to diabetes. I will try Pamelor 10 mg Q hs. side effects are explained. Follow up in 4 weeks.  2. She refuses for blood tests today.   She will fax recent labs from [facility]. She is on Lantus 25 units Q day and Novolog sliding scale.  I explained the risks and complications of D.M. The importance of following diabetic diet, and regular exercises have been explained. Diabetic feet care and importance of regular eye examination have been explained. FSBS should be checked daily as explained. Bring blood sugar records at next visit.   3. She was on Lipitor in past but stopped it. I advised reevaluation with FBW in 4 weeks. The risks and complications of dyslipidemia have been explained. Importance of low fat diet, and aerobic  exercises have been explained.    4. She is a non-compliant patient who does not keep follow up appointment. Importance of proper follow up for her medical conditions is explained. She agrees to return in 4 weeks.

## 2012-01-24 ENCOUNTER — Ambulatory Visit (INDEPENDENT_AMBULATORY_CARE_PROVIDER_SITE_OTHER): Payer: Exclusive Provider Organization | Admitting: Internal Medicine

## 2012-03-21 NOTE — Procedures (Unsigned)
PATIENT NAME:  Sara Ball, Sara Ball             MED REC NO:      [id number]      ORDERING MD:   Manuela Schwartz, MD           EXAM DATE:       04/26/2004      DICTATING MD:  Payton Doughty, MD          ACCOUNT NO:     [id number]      PATIENT DOB:     Mar 22, 1975                  PATIENT LOC:     [id number]                  TAPE: [id number]            EXAMINATION: TRANSTHORACIC M-MODE, 2-D, DOPPLER AND COLOR FLOW      ECHOCARDIOGRAM.            CLINICAL INDICATION: A 37 year old female with pregnancy, diabetes mellitus      and history of pulmonary edema.            INTERPRETATION: M-Mode Measurements: Right ventricle 1.7 cm. Aorta 2.7 cm.      ACS 1.7 cm. LA 2.6 cm. LV diastole 4.2 cm. LV systole 2.5 cm. IVS 0.8 cm.      LVPW 0.8 cm. Fractional shortening 35%. Ejection fraction 65%.            The study is of fair technical quality. The left ventricle has normal      chamber dimensions, wall thicknesses and contractility, both globally and      regionally. The diameters of the aortic root, left atrium, right ventricle      and right atrium are normal. Right ventricular contractility appears      normal. The aortic, mitral, pulmonic and tricuspid valves all appear normal      in structure and movement. No intracardiac thrombi or masses are      visualized. There is no pericardial effusion.            Doppler analysis reveals no evidence of significant stenotic or regurgitant      valvular lesions, intracardiac shunts, diastolic left ventricular      dysfunction or pulmonary hypertension (the estimated right ventricular      systolic pressure is 20 mmHg).            FINAL IMPRESSION/ASSESSMENT: Normal study.                                    __________________________________      Payton Doughty, MD            [id number]      D: 04/26/2004  3:44 P      T: 04/26/2004  3:54 P      J: [id number]      N: [id number]      CC:   Payton Doughty, MD

## 2012-03-21 NOTE — Discharge Summary (Unsigned)
DATE OF BIRTH:                        May 31, 1975            ADMISSION DATE:                     05/28/2004      DISCHARGE DATE:                     05/30/2004            ATTENDING PHYSICIAN:                  Denny Peon Al-Kouatly, MD                  ADMISSION DIAGNOSES      1.   A 37 year old female, gravida 2, para 0-1-0-1 at 23 weeks and 4 days      with class B poorly-controlled diabetic.      2.   Migraine headache.            DISCHARGE DIAGNOSES      1.   A 37 year old female, gravida 2, para 0-1-0-1 at 23 weeks and 4 days      with class B poorly-controlled diabetic.      2.   Migraine headache.            HOSPITAL COURSE: On 05/28/2004, the patient is a 37 year old gravida 2,      para 0-1-0-1 at 23 weeks and 4 days by LMP consistent with early trimester      sonogram, who is a known patient to the clinic with a class B diabetic and      labile diabetic control, who was recently admitted for poor diabetic      control to the antepartum service but signed out AMA 3 days prior to this      readmission, who presented on 05/28/2004 complaining of headache beginning      2 days after she signed out AMA, uncontrolled with her Imitrex that she      took on her own without any physician instructions and mild abdominal pain      that is resistant, and nausea again.  The patient was admitted from      04/20/2004 until 05/24/2004, where she signed out AMA for poor diabetic      control of class B.  The patient has through that admission a level 2      sonogram showing normal anatomy, as well as a fetal echocardiogram and a      24-hour urine protein showing 123 mg protein with a creatinine clearance of      69 with a total volume of 1750.  The patient, during the admission, always      complained of headache and was uncontrolled except with Dilaudid and      vistaril p.r.n.  Fetal testing during the admission was within normal      limits in terms of NST's.  The patient had endocrinology consult, where she      ended up on  20 units of Lantus and sliding scale of Novolog, which actually      did control her sugars, but she signed out AMA.  On admission, the patient      stated that after she signed AMA, she was checking her fingersticks and      using the sliding scale,  according to her, and she was getting sliding      scale value of between 46 and 200.  In triage, the patient was      hemodynamically stable, vital signs were normal, however, her fingerstick      was 180 and she continued complaining of the headache and the patient was      admitted for glycemic control for the migraine headache.  Initially in      triage, she had uterine irritability, but her cervix was fingertip and 25      and once she had IV fluids, the contraction stopped and the patient was      then admitted to the floor.  On hospital day 1, the patient hemodynamically      stable, feeling better, her headache was only controlled with Dilaudid and      hemoglobin A1c was repeated and was 7.2.  Fingersticks during this 2-day      admission were between 67 and 185, and the patient's symptoms improved in      terms of the headache.  She had a UA to rule out DKA, which was negative,      and after reviewing her fingersticks after 2 days on the current regimen of      20 of Lantus and Novolog sliding scale, the patient was discharged with      instructions to continue her current regimen of Lantus 20 q.a.m. and      Novolog sliding scale that was originally prescribed by      the endocrinologist and to return to the clinic on the next high risk day      and strict headache and DKA precautions in terms of headache, dizziness,      change of alert status, and abdominal pain were given, and the patient was      discharged to follow up in the high-risk clinic on 05/30/2004.                                          ___________________________________    Date Signed: __________      Celso Sickle, MD ([id number])            D: 05/30/2004 by Ashok Croon, MD      T:  05/31/2004 by [id number] ([id number]) (N: [id number])      R: 06/17/2004 by kf      cc:  Celso Sickle, MD

## 2012-03-21 NOTE — Procedures (Unsigned)
PATIENT NAME:  Sara Ball, Sara Ball             MED REC NO:      [id number]      ORDERING MD:                                 Francia Greaves DATE:       04/21/2004      DICTATING MD:  Nancie Neas, MD          ACCOUNT NO:     [id number]      PATIENT DOB:     04-13-1975                  PATIENT LOC:     [id number]                  EXAMINATION: FETAL ECHOCARDIOGRAM.            CLINICAL INDICATION: Diabetes with suspected congenital heart disease. The      mother is a 74 year old lady who is [redacted] weeks pregnant and is known to have      diabetes. She got pregnant while her diabetes was under marginal control.            INTERPRETATION: A complete fetal 2-D echocardiogram was performed.            1.   The fetal situs is situs solitus. The parietal diameter is 3.66 cm.      2.   The systemic venous return is normal. The ductus venosus is well      imaged. The peak velocity through the ductus venosus was 20 cm/s. The      systemic venous return returned normally to the right-sided atrium. The      patent foramen ovale was normal with right-to-left flow across it. The peak      diastolic velocity across the patent foramen ovale was 40 cm/s which is      normal.      3.   The right ventricle is normal in size as measured in the apical      4-chamber view 4.5 mm. The left ventricle measured 3.5 mm. The ratio      between the RV to LV is normal.      4.   The tricuspid valve measured 0.6 mm and the mitral valve measured 0.5      mm. The A velocity across the mitral valve was 36 m/s and the E velocity      was 31 cm/s.      5.   The right ventricle and left ventricle were devoid of any thrombi or      echogenic foci.      6.   The great artery relationship was normal.      7.   The aortic valve measured 0.3 cm and the pulmonary valve measured 0.34      cm which is normal for this age.      8.   The peak systolic velocity across the aortic valve was 45 cm/s. The      peak systolic velocity across the pulmonary valve was 34 cm/s which is  also      normal.      9.   The aortic arch was normal.      10.  The descending aorta at the level of the sinus of Valsalva measured  0.35 cm which is also normal.      11.  I could not for sure determine the origin of the coronary arteries at      this stage _____ probably beyond the resolution of _____.      12.  There was no pericardial effusion.      13.  The cardiac function is normal.      14.  The heart rate is 156 beats per minute.            FINAL IMPRESSION/ASSESSMENT      1.   Normal fetal structure, function and rhythm.      2.   Also, there is a 3-vessel cord with normal flow velocities. There is      no diastolic reversal of flow.            RECOMMENDATION: I would like to see this patient at 25 weeks of gestation      mainly to measure the cardiac chambers again.                                    __________________________________      Nancie Neas, MD            [id number]      D: 04/21/2004 10:43 A      T: 04/24/2004  7:12 A      J: [id number]      N: [id number]      CC:   Nancie Neas, MD

## 2012-03-21 NOTE — Discharge Summary (Unsigned)
DATE OF BIRTH:                        06-Jun-1975            ADMISSION DATE:                     07/09/2004      DISCHARGE DATE:                     07/12/2004            ATTENDING PHYSICIAN:                  Peggye Fothergill, MD                  ADMITTING DIAGNOSIS:  Preterm labor, class D diabetes, 30-week intrauterine      pregnancy.            DISCHARGE DIAGNOSIS:   Preterm labor, class D diabetes, 30-week      intrauterine pregnancy, vaginal culture demonstrating ureaplasma positive.            HOSPITAL COURSE:  The patient was admitted on 07/09/2004 for preterm labor.      She had vaginal cultures done as well as a fetal fibronectin, which turned      out to be positive.  A vaginal exam demonstrated a cervix which was      fingertip dilated, 50% effaced, the station was very, very high.  Her      cultures did come back.  She is ureaplasma positive.  Her GBS antigen was      positive, but her culture was negative and therefore it was GBS negative      status.  While in house, she was given betamethasone x 2 and continued on      her sliding scale insulin regimen that she had previously been on.  She is      being followed by the endocrinologist.  Her finger sticks have been      elevated secondary to betamethasone but have responded well to her sliding      scale insulin regimen.  For her preterm labor, she was put on Procardia 20      mg p.o. q.6 h. and has done well with that.  She has been acontractile for      the last 24+ hours.  The patient will be discharged to home in stable      condition.            She will be discharged home on the following medications:  Azithromycin 250      mg p.o. daily for an additional 2 days, which will make her regimen a total      of 5 days.  Procardia 20 mg q.6 h., Lantus 20 units subcutaneously every      morning, sliding scale insulin from her previous regimen.  She was given      the following precautions that if she developed sharp abdominal pain,      fever, burning with  urination, contractions, leakage of fluid, or vaginal      bleeding, that she should call and possibly be seen in triage.  She was      also instructed that she should refrain from putting anything into her      vagina, including no sexual intercourse, no douching, and no tampons.  The  patient agreed with the plan of care and all instructions and demonstrated      understanding of all instructions.                                          ___________________________________________          Date      Signed:__________      Peggye Fothergill, MD ([id number])            D: 07/12/2004 by Roel Cluck, MD      T: 07/12/2004 by [id number] ([id number]) Dorris Carnes: [id number])      R: 07/22/2004 by Marylu Lund      cc:  Peggye Fothergill, MD

## 2012-06-25 ENCOUNTER — Ambulatory Visit: Payer: Self-pay | Admitting: Family Medicine

## 2012-07-11 ENCOUNTER — Emergency Department: Payer: Self-pay | Admitting: Emergency Medicine

## 2012-07-11 LAB — CBC
HCT: 38.5 % (ref 35.0–47.0)
HGB: 12.3 g/dL (ref 12.0–16.0)
MCH: 25.5 pg — ABNORMAL LOW (ref 26.0–34.0)
MCV: 80 fL (ref 80–100)
RBC: 4.82 10*6/uL (ref 3.80–5.20)
WBC: 8.6 10*3/uL (ref 3.6–11.0)

## 2012-07-11 LAB — BASIC METABOLIC PANEL
Anion Gap: 3 — ABNORMAL LOW (ref 7–16)
BUN: 10 mg/dL (ref 7–18)
Calcium, Total: 9.1 mg/dL (ref 8.5–10.1)
Co2: 31 mmol/L (ref 21–32)
EGFR (African American): >60
Glucose: 132 mg/dL — ABNORMAL HIGH (ref 65–99)
Sodium: 136 mmol/L (ref 136–145)

## 2012-07-11 LAB — CK TOTAL AND CKMB (NOT AT ARMC)
CK, Total: 152 U/L (ref 21–215)
CK-MB: 0.9 ng/mL (ref 0.5–3.6)

## 2012-08-28 ENCOUNTER — Ambulatory Visit: Payer: Self-pay | Admitting: Family Medicine

## 2012-10-14 ENCOUNTER — Ambulatory Visit: Payer: Self-pay | Admitting: Family Medicine

## 2012-10-20 ENCOUNTER — Other Ambulatory Visit (INDEPENDENT_AMBULATORY_CARE_PROVIDER_SITE_OTHER): Payer: Self-pay | Admitting: Internal Medicine

## 2013-09-29 IMAGING — US US PELV - US TRANSVAGINAL
1 series · 14 of 25 positions shown · non-contrast
Comparison: none

REASON FOR EXAM: dysfunctional uterine bleeding
COMMENTS:

PROCEDURE:     US  - US PELVIS EXAM W/TRANSVAGINAL  - August 28, 2012  [DATE]
RESULT:     Comparison: None.
TECHNIQUE: Multiple grayscale and color Doppler images were obtained of the
pelvis via transabdominal and endovaginal ultrasound.

[Series 1: us pelv - us transvaginal · 0.26mm/px · 14 of 81 slices shown]
[im 1/81]
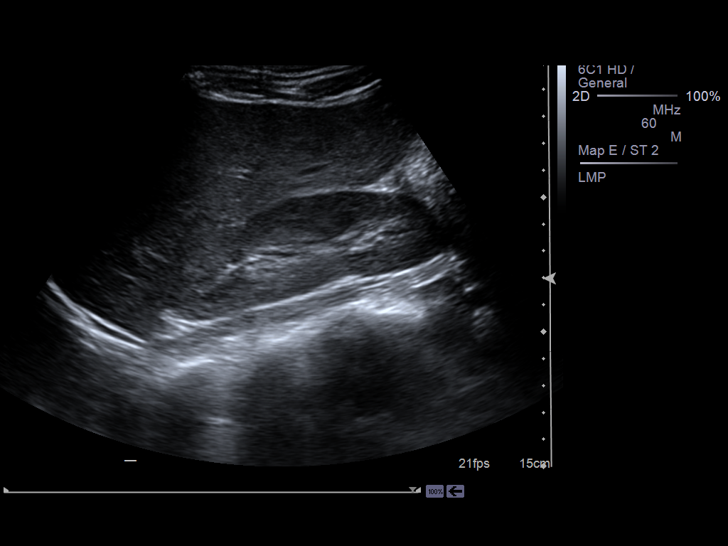
[im 7/81]
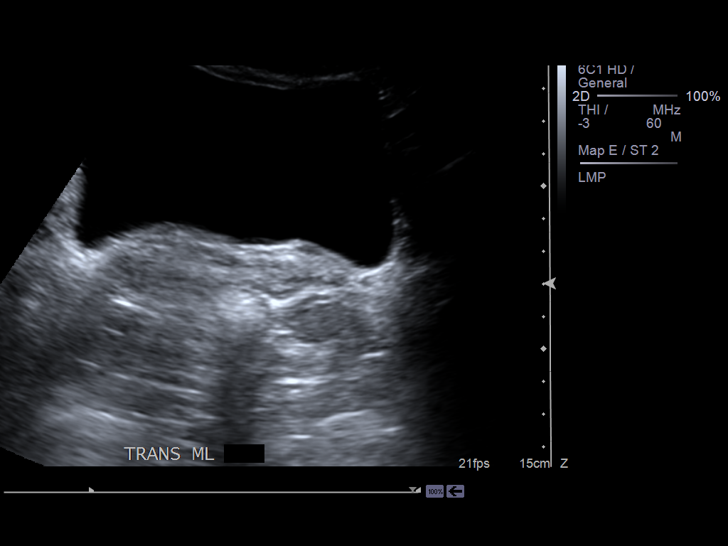
[im 14/81]
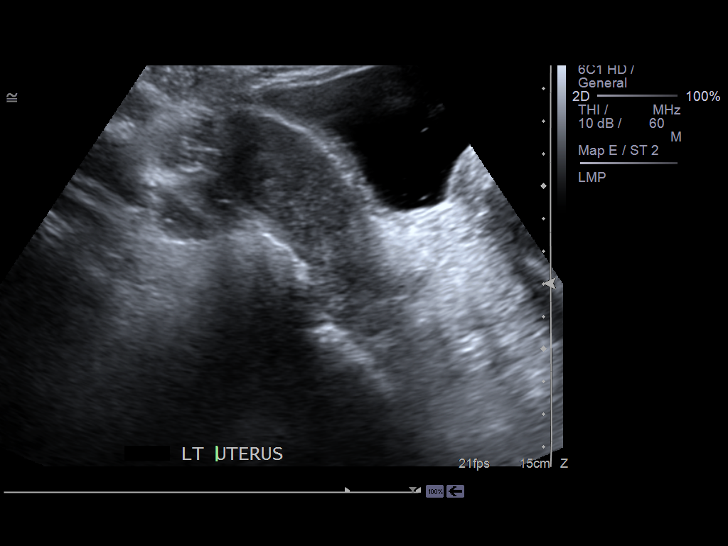
[im 21/81]
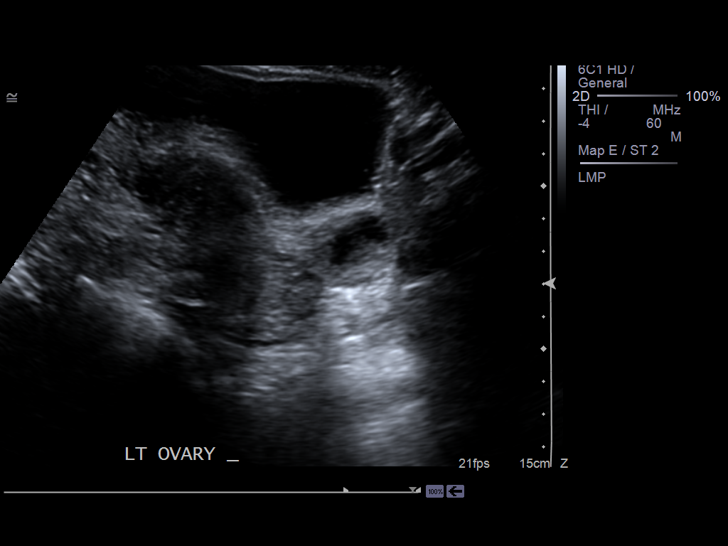
[im 27/81]
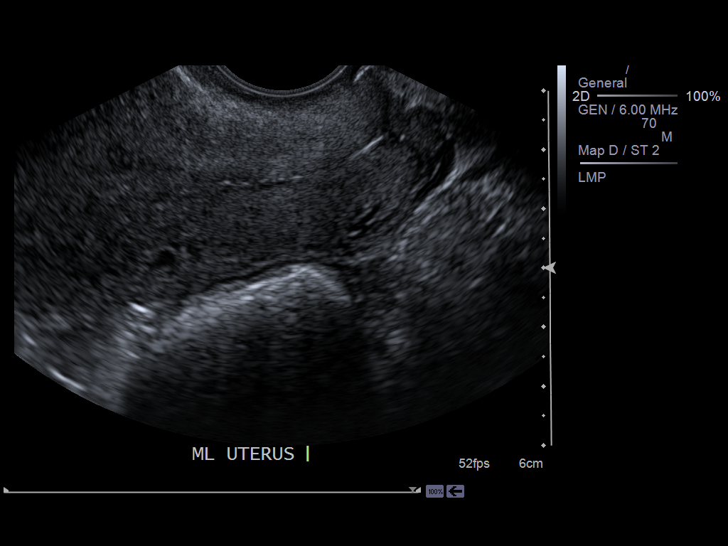
[im 31/81]
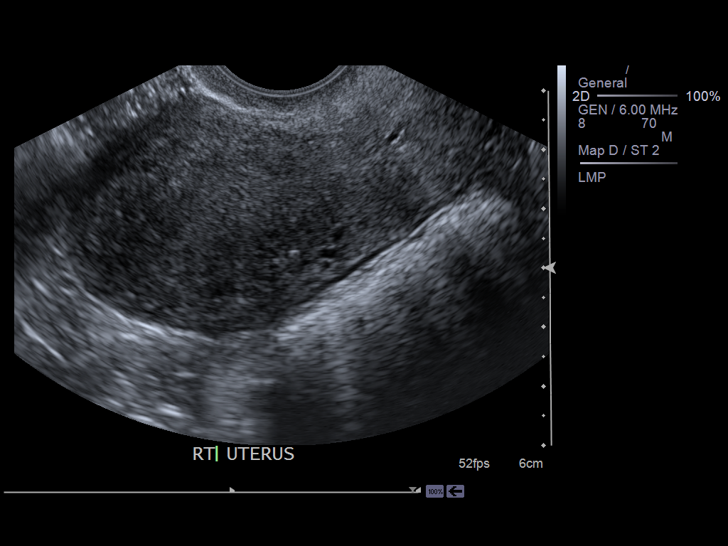
[im 37/81]
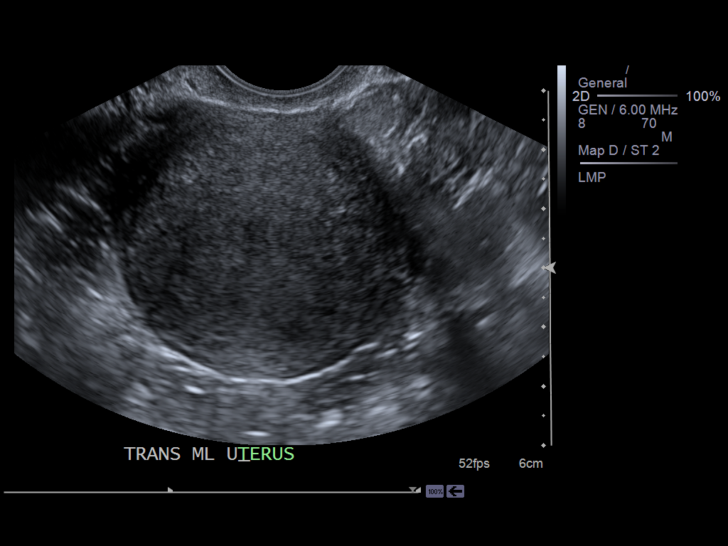
[im 44/81]
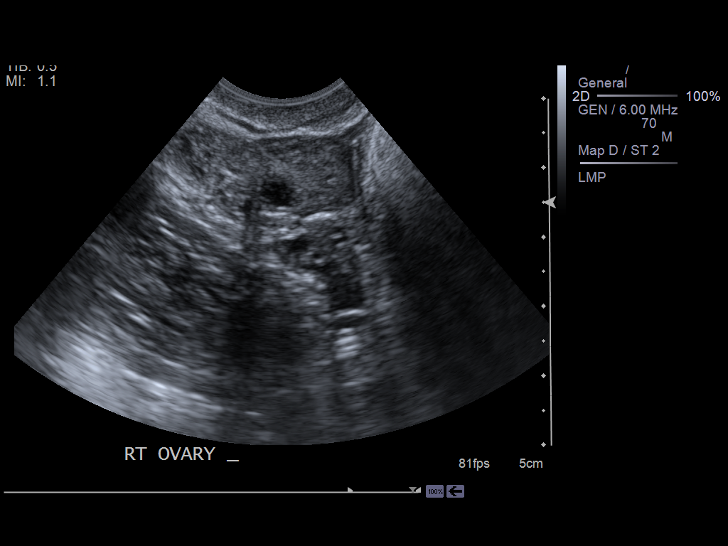
[im 51/81]
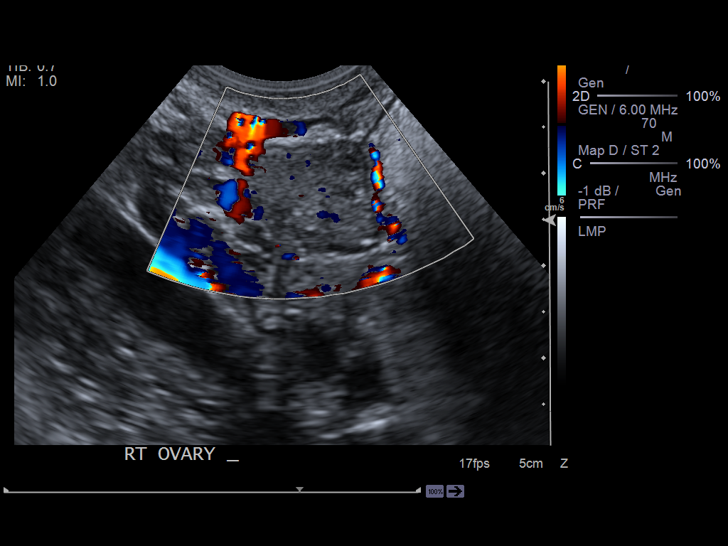
[im 54/81]
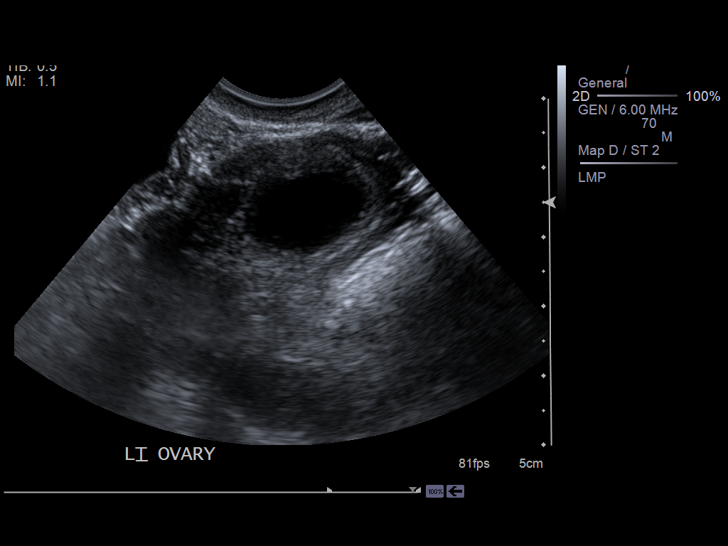
[im 61/81]
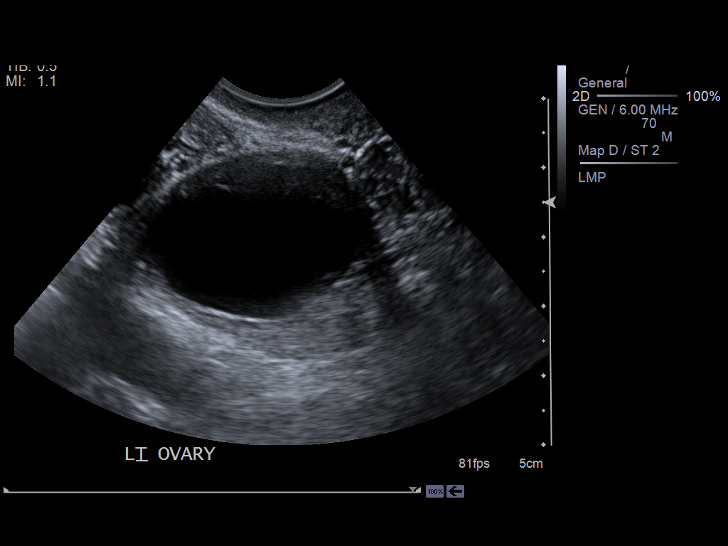
[im 67/81]
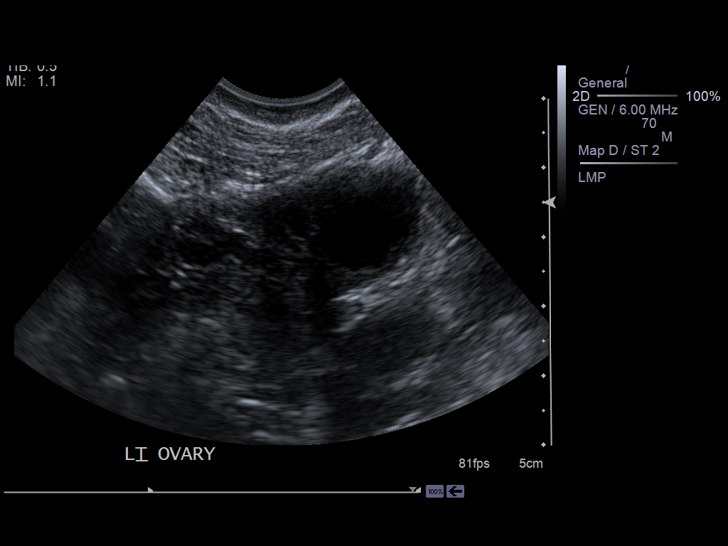
[im 74/81]
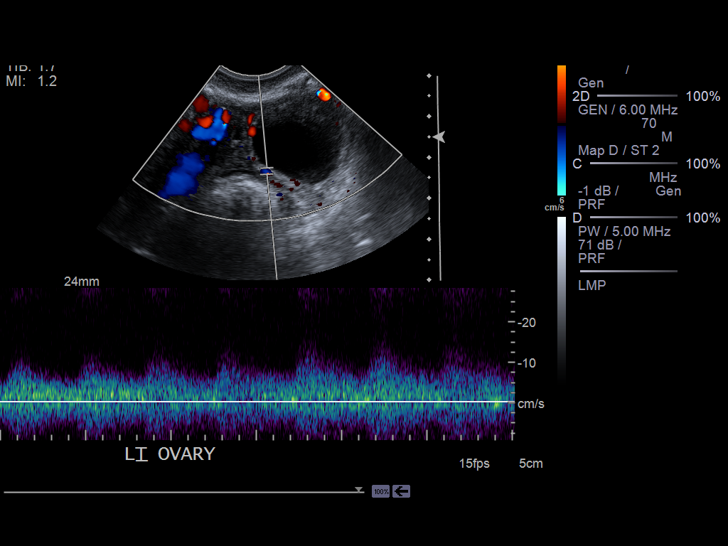
[im 81/81]
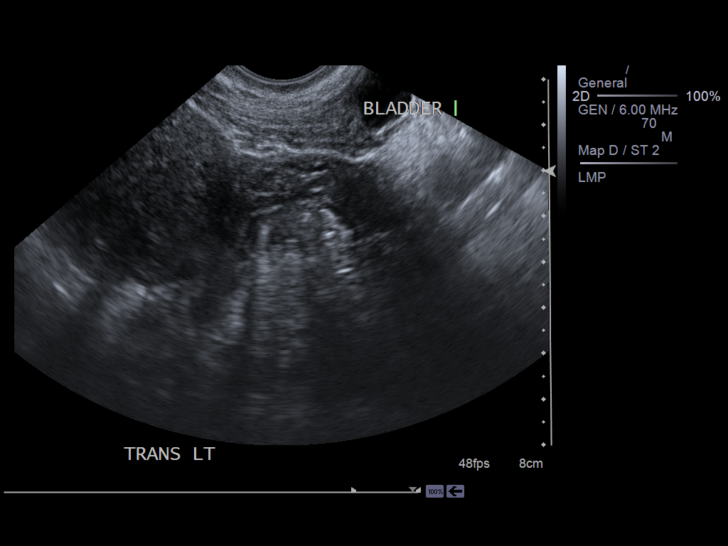

[14 of 25 positions shown; findings below may reference images not displayed]

Funnies:
The uterus measures 9.1 x 5.6 x 4.6 cm. The endometrial stripe measures 2 mm
in thickness.

The right ovary measures 3.0 x 2.0 x 1.6 cm. The left ovary measures 4.5 x
3.3 x 3.1 cm. There are 2 small cysts in the left ovary. The largest
measures 2.8 x 2.7 x 2.1 cm. Arterial and venous spectral Doppler waveforms
are demonstrated in bilateral ovaries. No adnexal mass identified.
IMPRESSION: No acute findings. Endometrial stripe is within normal limits.

[REDACTED]

## 2014-12-21 ENCOUNTER — Ambulatory Visit (INDEPENDENT_AMBULATORY_CARE_PROVIDER_SITE_OTHER): Payer: Self-pay | Admitting: Ophthalmology

## 2014-12-21 DIAGNOSIS — H01005 Unspecified blepharitis left lower eyelid: Secondary | ICD-10-CM

## 2014-12-21 DIAGNOSIS — H01002 Unspecified blepharitis right lower eyelid: Secondary | ICD-10-CM

## 2014-12-21 DIAGNOSIS — H01004 Unspecified blepharitis left upper eyelid: Secondary | ICD-10-CM

## 2014-12-21 DIAGNOSIS — H0289 Other specified disorders of eyelid: Secondary | ICD-10-CM

## 2014-12-21 DIAGNOSIS — E103299 Type 1 diabetes mellitus with mild nonproliferative diabetic retinopathy without macular edema, unspecified eye: Secondary | ICD-10-CM

## 2014-12-21 DIAGNOSIS — H0288B Meibomian gland dysfunction left eye, upper and lower eyelids: Secondary | ICD-10-CM | POA: Insufficient documentation

## 2014-12-21 DIAGNOSIS — H0288A Meibomian gland dysfunction right eye, upper and lower eyelids: Secondary | ICD-10-CM

## 2014-12-21 DIAGNOSIS — H527 Unspecified disorder of refraction: Secondary | ICD-10-CM | POA: Insufficient documentation

## 2014-12-21 NOTE — Progress Notes (Signed)
I/P:   1. IDDM  - mild diabetic retinopathy. Stressed better BG control    2. MGD/dry eyes  WC, LH and ATs    3. Very mild refractive error  Unlikely to be visually significant. Patient notes vision gets blurry w/out her glasses. Monitor for now. She is to bring her glasses next time.     Follow up: 9 m for repeat DFE

## 2015-09-21 ENCOUNTER — Ambulatory Visit: Payer: Self-pay | Admitting: Ophthalmology

## 2016-12-13 ENCOUNTER — Emergency Department: Payer: Medicaid - Out of State

## 2016-12-13 ENCOUNTER — Encounter: Payer: Self-pay | Admitting: Emergency Medicine

## 2016-12-13 ENCOUNTER — Inpatient Hospital Stay
Admission: EM | Admit: 2016-12-13 | Discharge: 2016-12-17 | DRG: 059 | Disposition: A | Discharge status: Home / Self Care | Payer: Medicaid - Out of State | Attending: Internal Medicine | Admitting: Internal Medicine

## 2016-12-13 DIAGNOSIS — H469 Unspecified optic neuritis: Secondary | ICD-10-CM

## 2016-12-13 DIAGNOSIS — R51 Headache: Secondary | ICD-10-CM | POA: Diagnosis present

## 2016-12-13 DIAGNOSIS — F17210 Nicotine dependence, cigarettes, uncomplicated: Secondary | ICD-10-CM | POA: Diagnosis present

## 2016-12-13 DIAGNOSIS — G379 Demyelinating disease of central nervous system, unspecified: Principal | ICD-10-CM | POA: Diagnosis present

## 2016-12-13 DIAGNOSIS — M549 Dorsalgia, unspecified: Secondary | ICD-10-CM | POA: Diagnosis present

## 2016-12-13 DIAGNOSIS — Z833 Family history of diabetes mellitus: Secondary | ICD-10-CM

## 2016-12-13 DIAGNOSIS — K5900 Constipation, unspecified: Secondary | ICD-10-CM | POA: Diagnosis present

## 2016-12-13 DIAGNOSIS — E1165 Type 2 diabetes mellitus with hyperglycemia: Secondary | ICD-10-CM | POA: Diagnosis present

## 2016-12-13 DIAGNOSIS — G35 Multiple sclerosis: Secondary | ICD-10-CM | POA: Diagnosis present

## 2016-12-13 DIAGNOSIS — Z794 Long term (current) use of insulin: Secondary | ICD-10-CM

## 2016-12-13 DIAGNOSIS — R519 Headache, unspecified: Secondary | ICD-10-CM

## 2016-12-13 HISTORY — DX: Type 2 diabetes mellitus without complications: E11.9

## 2016-12-13 LAB — APTT: aPTT: 25 seconds (ref 24–36)

## 2016-12-13 LAB — DIFFERENTIAL
BASOS PCT: 1 %
Basophils Absolute: 0.1 10*3/uL (ref 0–0.1)
EOS PCT: 2 %
Eosinophils Absolute: 0.1 10*3/uL (ref 0–0.7)
LYMPHS PCT: 36 %
Lymphs Abs: 2.2 10*3/uL (ref 1.0–3.6)
MONO ABS: 0.5 10*3/uL (ref 0.2–0.9)
MONOS PCT: 8 %
NEUTROS ABS: 3.3 10*3/uL (ref 1.4–6.5)
Neutrophils Relative %: 53 %

## 2016-12-13 LAB — COMPREHENSIVE METABOLIC PANEL
ALK PHOS: 72 U/L (ref 38–126)
ALT: 16 U/L (ref 14–54)
ANION GAP: 9 (ref 5–15)
AST: 27 U/L (ref 15–41)
Albumin: 4.2 g/dL (ref 3.5–5.0)
BUN: 27 mg/dL — ABNORMAL HIGH (ref 6–20)
CHLORIDE: 101 mmol/L (ref 101–111)
CO2: 27 mmol/L (ref 22–32)
Calcium: 9.9 mg/dL (ref 8.9–10.3)
Creatinine, Ser: 1.09 mg/dL — ABNORMAL HIGH (ref 0.44–1.00)
Glucose, Bld: 122 mg/dL — ABNORMAL HIGH (ref 65–99)
POTASSIUM: 4.1 mmol/L (ref 3.5–5.1)
SODIUM: 137 mmol/L (ref 135–145)
Total Bilirubin: 0.1 mg/dL — ABNORMAL LOW (ref 0.3–1.2)
Total Protein: 7.2 g/dL (ref 6.5–8.1)

## 2016-12-13 LAB — CBC
HEMATOCRIT: 35.8 % (ref 35.0–47.0)
Hemoglobin: 11.8 g/dL — ABNORMAL LOW (ref 12.0–16.0)
MCH: 25.5 pg — AB (ref 26.0–34.0)
MCHC: 32.9 g/dL (ref 32.0–36.0)
MCV: 77.6 fL — AB (ref 80.0–100.0)
Platelets: 221 10*3/uL (ref 150–440)
RBC: 4.61 MIL/uL (ref 3.80–5.20)
RDW: 17.2 % — AB (ref 11.5–14.5)
WBC: 6.2 10*3/uL (ref 3.6–11.0)

## 2016-12-13 LAB — TROPONIN I: Troponin I: <0.03 ng/mL (ref <0.03)

## 2016-12-13 LAB — PROTIME-INR
INR: 0.95
PROTHROMBIN TIME: 12.6 s (ref 11.4–15.2)

## 2016-12-13 MED ORDER — SODIUM CHLORIDE 0.9 % IV SOLN
1000.0000 mg | Freq: Once | INTRAVENOUS | Status: AC
  Administered 2016-12-14: 1000 mg via INTRAVENOUS
  Filled 2016-12-13: qty 8

## 2016-12-13 MED ORDER — TETRACAINE HCL 0.5 % OP SOLN
2.0000 [drp] | Freq: Once | OPHTHALMIC | Status: AC
  Administered 2016-12-13: 2 [drp] via OPHTHALMIC
  Filled 2016-12-13: qty 2

## 2016-12-13 MED ORDER — GADOBENATE DIMEGLUMINE 529 MG/ML IV SOLN
10.0000 mL | Freq: Once | INTRAVENOUS | Status: AC | PRN
  Administered 2016-12-13: 10 mL via INTRAVENOUS
  Filled 2016-12-13: qty 10

## 2016-12-13 MED ORDER — DIPHENHYDRAMINE HCL 50 MG/ML IJ SOLN
25.0000 mg | INTRAMUSCULAR | Status: AC
  Administered 2016-12-13: 25 mg via INTRAVENOUS

## 2016-12-13 MED ORDER — DIPHENHYDRAMINE HCL 50 MG/ML IJ SOLN
INTRAMUSCULAR | Status: AC
  Administered 2016-12-13: 25 mg via INTRAVENOUS
  Filled 2016-12-13: qty 1

## 2016-12-13 MED ORDER — PROCHLORPERAZINE EDISYLATE 5 MG/ML IJ SOLN
10.0000 mg | INTRAMUSCULAR | Status: AC
  Administered 2016-12-13: 10 mg via INTRAVENOUS

## 2016-12-13 MED ORDER — PROCHLORPERAZINE EDISYLATE 5 MG/ML IJ SOLN
INTRAMUSCULAR | Status: AC
  Administered 2016-12-13: 10 mg via INTRAVENOUS
  Filled 2016-12-13: qty 2

## 2016-12-13 MED ORDER — FLUORESCEIN SODIUM 1 MG OP STRP
1.0000 | ORAL_STRIP | Freq: Once | OPHTHALMIC | Status: AC
  Administered 2016-12-13: 1 via OPHTHALMIC
  Filled 2016-12-13: qty 1

## 2016-12-13 NOTE — ED Notes (Signed)
Left eye pain for over one week.  Patient described pain as throbbing and pain increases when moving head.  Patient also reports having a blind spot.

## 2016-12-13 NOTE — ED Provider Notes (Signed)
[facility] Emergency Department Provider Note    First MD Initiated Contact with Patient 12/13/16 1802     (approximate)  I have reviewed the triage vital signs and the nursing notes.   HISTORY  Chief Complaint Eye Problem    HPI Anna Singh is a 41 y.o. female Presenting with left eye pain for the past week. Describes it as throbbing in nature and located on the upper eyelid associated with a blurry spot that she feels like "there is a hair in front of her eye "in the upper portion of her vision. Has never had symptoms like this before. States that the headache became more severe today. Denies any chest pain or shortness of breath. No history of MS. No history of glaucoma. Does have family history of MS and is a diabetic.   Past Medical History:  Diagnosis Date  . Diabetes mellitus without complication (HCC)    History reviewed. No pertinent family history. Past Surgical History:  Procedure Laterality Date  . APPENDECTOMY     There are no active problems to display for this patient.     Prior to Admission medications   Medication Sig Start Date End Date Taking? Authorizing Provider  insulin aspart (NOVOLOG) 100 UNIT/ML injection Inject into the skin 3 (three) times daily before meals.   Yes [provider]  insulin glargine (LANTUS) 100 UNIT/ML injection Inject into the skin at bedtime.   Yes [provider]    Allergies Iodine    Social History Social History  Substance Use Topics  . Smoking status: Light Tobacco Smoker    Types: Cigarettes  . Smokeless tobacco: Never Used     Comment: 2 per day  . Alcohol use 1.2 oz/week    2 Cans of beer per week    Review of Systems Patient denies headaches, rhinorrhea, blurry vision, numbness, shortness of breath, chest pain, edema, cough, abdominal pain, nausea, vomiting, diarrhea, dysuria, fevers, rashes or hallucinations unless otherwise stated above in  HPI. ____________________________________________   PHYSICAL EXAM:  VITAL SIGNS: Vitals:   12/13/16 1658  BP: (!) 183/95  Pulse: (!) 103  Resp: 18  Temp: 98.9 F (37.2 C)  SpO2: 100%    Constitutional: Alert and oriented. Well appearing and in no acute distress. Eyes: Conjunctivae are normal. No abrasion under woods lamp, PER,  + afferent pupillary defect on left. EOMI, no visual field cuts, IOP L 14, R 13.  Does endorse scotoma to upper third of left visual field Head: Atraumatic. Nose: No congestion/rhinnorhea. Mouth/Throat: Mucous membranes are moist.   Neck: No stridor. Painless ROM.  Cardiovascular: Normal rate, regular rhythm. Grossly normal heart sounds.  Good peripheral circulation. Respiratory: Normal respiratory effort.  No retractions. Lungs CTAB. Gastrointestinal: Soft and nontender. No distention. No abdominal bruits. No CVA tenderness. Genitourinary: deferred Musculoskeletal: No lower extremity tenderness nor edema.  No joint effusions. Neurologic:  Normal speech and language. No gross focal neurologic deficits are appreciated. No facial droop Skin:  Skin is warm, dry and intact. No rash noted. Psychiatric: Mood and affect are normal. Speech and behavior are normal.  ____________________________________________   LABS (all labs ordered are listed, but only abnormal results are displayed)  Results for orders placed or performed during the hospital encounter of 12/13/16 (from the past 24 hour(s))  Protime-INR     Status: None   Collection Time: 12/13/16  5:14 PM  Result Value Ref Range   Prothrombin Time 12.6 11.4 - 15.2 seconds  INR 0.95   APTT     Status: None   Collection Time: 12/13/16  5:14 PM  Result Value Ref Range   aPTT 25 24 - 36 seconds  CBC     Status: Abnormal   Collection Time: 12/13/16  5:14 PM  Result Value Ref Range   WBC 6.2 3.6 - 11.0 K/uL   RBC 4.61 3.80 - 5.20 MIL/uL   Hemoglobin 11.8 (L) 12.0 - 16.0 g/dL   HCT 71.6 96.7 - 89.3 %    MCV 77.6 (L) 80.0 - 100.0 fL   MCH 25.5 (L) 26.0 - 34.0 pg   MCHC 32.9 32.0 - 36.0 g/dL   RDW 81.0 (H) 17.5 - 10.2 %   Platelets 221 150 - 440 K/uL  Differential     Status: None   Collection Time: 12/13/16  5:14 PM  Result Value Ref Range   Neutrophils Relative % 53 %   Neutro Abs 3.3 1.4 - 6.5 K/uL   Lymphocytes Relative 36 %   Lymphs Abs 2.2 1.0 - 3.6 K/uL   Monocytes Relative 8 %   Monocytes Absolute 0.5 0.2 - 0.9 K/uL   Eosinophils Relative 2 %   Eosinophils Absolute 0.1 0 - 0.7 K/uL   Basophils Relative 1 %   Basophils Absolute 0.1 0 - 0.1 K/uL  Comprehensive metabolic panel     Status: Abnormal   Collection Time: 12/13/16  5:14 PM  Result Value Ref Range   Sodium 137 135 - 145 mmol/L   Potassium 4.1 3.5 - 5.1 mmol/L   Chloride 101 101 - 111 mmol/L   CO2 27 22 - 32 mmol/L   Glucose, Bld 122 (H) 65 - 99 mg/dL   BUN 27 (H) 6 - 20 mg/dL   Creatinine, Ser 5.85 (H) 0.44 - 1.00 mg/dL   Calcium 9.9 8.9 - 27.7 mg/dL   Total Protein 7.2 6.5 - 8.1 g/dL   Albumin 4.2 3.5 - 5.0 g/dL   AST 27 15 - 41 U/L   ALT 16 14 - 54 U/L   Alkaline Phosphatase 72 38 - 126 U/L   Total Bilirubin 0.1 (L) 0.3 - 1.2 mg/dL   GFR calc non Af Amer >60 >60 mL/min   GFR calc Af Amer >60 >60 mL/min   Anion gap 9 5 - 15  Troponin I     Status: None   Collection Time: 12/13/16  5:14 PM  Result Value Ref Range   Troponin I <0.03 <0.03 ng/mL   ____________________________________________  EKG My review and personal interpretation at Time: 17:11   Indication: headache  Rate: 100  Rhythm: sinus Axis: normal Other: normal intervals, no stemi, non specific st changes ____________________________________________  RADIOLOGY  I personally reviewed all radiographic images ordered to evaluate for the above acute complaints and reviewed radiology reports and findings.  These findings were personally discussed with the patient.  Please see medical record for radiology  report.  ____________________________________________   PROCEDURES  Procedure(s) performed:  Procedures    Critical Care performed: no ____________________________________________   INITIAL IMPRESSION / ASSESSMENT AND PLAN / ED COURSE  Pertinent labs & imaging results that were available during my care of the patient were reviewed by me and considered in my medical decision making (see chart for details).  DDX: migraine, tension, abrasion, cellulitis, abscess, cva, ms, retinal detachment  Anna Singh is a 41 y.o. who presents to the ED with pain as described above. Patient is otherwise well-appearing. No focal deficits with exception  of visual disturbances and suggestive of a mild afferent pupillary defect. No evidence of infectious process. CT head is normal. Will give migraine cocktail in case this is a complex migraine. Does not appear consistent with glaucoma or retinal detachment.  The patient will be placed on continuous pulse oximetry and telemetry for monitoring.  Laboratory evaluation will be sent to evaluate for the above complaints.     Clinical Course as of Dec 14 2246  Thu Dec 13, 2016  1909 bedside ultrasound shows no evidence of detachment, abscess or abnormality to explain her discomfort. Based on her family history of MS and will order MRI to further evaluate for any evidence of optic neuritis or demyelinating disease.  [PR]  2236 patient with evidence of optic neuritis concerning for new onset of a mass. [facility] consult neurology.  [PR]  2245 I spoke with neurology at [facility] Dr. Laurence Slate regarding her presentation and he has recommended 1 g of Solu-Medrol and admission to hospital for further evaluation and management. I spoke with Dr. Lavera Guise who kindly agrees to admit patient for IV steroids and reassessment.  [PR]    Clinical Course User Index [PR] Willy Eddy, MD     ____________________________________________   FINAL CLINICAL IMPRESSION(S) / ED  DIAGNOSES  Final diagnoses:  Optic neuritis, left  Intractable headache, unspecified chronicity pattern, unspecified headache type      NEW MEDICATIONS STARTED DURING THIS VISIT:  New Prescriptions   No medications on file     Note:  This document was prepared using Dragon voice recognition software and may include unintentional dictation errors.    Willy Eddy, MD 12/13/16 2248

## 2016-12-13 NOTE — ED Notes (Signed)
ED Provider at bedside. 

## 2016-12-13 NOTE — ED Notes (Signed)
Pt in CT.

## 2016-12-13 NOTE — ED Notes (Signed)
Patient transported to MRI 

## 2016-12-13 NOTE — ED Notes (Signed)
Attempt for IV by this RN unsuccessful X 1.

## 2016-12-13 NOTE — ED Triage Notes (Signed)
Pt from home with throbbing eye pain x 1 week. States that she has a small blind spot in her eye and that it has expanded at times (when she bends over) and then goes back to the same small spot. Pt states she had a headache today when she was driving, but has not had headache other than that. She also reports some intermittent dizziness (this morning - lasted about a minute). Pt alert & oriented with NAD noted. Pt has DM, no hx of htn, 183/95 at triage.

## 2016-12-13 NOTE — ED Notes (Signed)
Pharmacy called about medication, states they will tube it to ED.

## 2016-12-14 DIAGNOSIS — H469 Unspecified optic neuritis: Secondary | ICD-10-CM

## 2016-12-14 DIAGNOSIS — G379 Demyelinating disease of central nervous system, unspecified: Secondary | ICD-10-CM | POA: Diagnosis present

## 2016-12-14 DIAGNOSIS — E1165 Type 2 diabetes mellitus with hyperglycemia: Secondary | ICD-10-CM | POA: Diagnosis present

## 2016-12-14 DIAGNOSIS — R51 Headache: Secondary | ICD-10-CM | POA: Diagnosis present

## 2016-12-14 DIAGNOSIS — K5900 Constipation, unspecified: Secondary | ICD-10-CM | POA: Diagnosis present

## 2016-12-14 DIAGNOSIS — M549 Dorsalgia, unspecified: Secondary | ICD-10-CM | POA: Diagnosis present

## 2016-12-14 DIAGNOSIS — G35 Multiple sclerosis: Secondary | ICD-10-CM | POA: Diagnosis present

## 2016-12-14 DIAGNOSIS — H538 Other visual disturbances: Secondary | ICD-10-CM | POA: Diagnosis present

## 2016-12-14 DIAGNOSIS — Z833 Family history of diabetes mellitus: Secondary | ICD-10-CM | POA: Diagnosis not present

## 2016-12-14 DIAGNOSIS — Z794 Long term (current) use of insulin: Secondary | ICD-10-CM | POA: Diagnosis not present

## 2016-12-14 DIAGNOSIS — F17210 Nicotine dependence, cigarettes, uncomplicated: Secondary | ICD-10-CM | POA: Diagnosis present

## 2016-12-14 LAB — BASIC METABOLIC PANEL
ANION GAP: 10 (ref 5–15)
Anion gap: 14 (ref 5–15)
Anion gap: 10 (ref 5–15)
BUN: 33 mg/dL — AB (ref 6–20)
BUN: 33 mg/dL — AB (ref 6–20)
BUN: 35 mg/dL — AB (ref 6–20)
CALCIUM: 8.6 mg/dL — AB (ref 8.9–10.3)
CALCIUM: 8.4 mg/dL — AB (ref 8.9–10.3)
CHLORIDE: 106 mmol/L (ref 101–111)
CO2: 18 mmol/L — ABNORMAL LOW (ref 22–32)
CO2: 21 mmol/L — ABNORMAL LOW (ref 22–32)
CO2: 20 mmol/L — AB (ref 22–32)
CREATININE: 1.39 mg/dL — AB (ref 0.44–1.00)
CREATININE: 1.27 mg/dL — AB (ref 0.44–1.00)
Calcium: 8.2 mg/dL — ABNORMAL LOW (ref 8.9–10.3)
Chloride: 102 mmol/L (ref 101–111)
Chloride: 100 mmol/L — ABNORMAL LOW (ref 101–111)
Creatinine, Ser: 1.22 mg/dL — ABNORMAL HIGH (ref 0.44–1.00)
GFR calc Af Amer: >60 mL/min (ref >60)
GFR calc Af Amer: 55 mL/min — ABNORMAL LOW (ref >60)
GFR calc Af Amer: >60 mL/min (ref >60)
GFR calc non Af Amer: 53 mL/min — ABNORMAL LOW (ref >60)
GFR, EST NON AFRICAN AMERICAN: 55 mL/min — AB (ref >60)
GFR, EST NON AFRICAN AMERICAN: 47 mL/min — AB (ref >60)
GLUCOSE: 463 mg/dL — AB (ref 65–99)
GLUCOSE: 407 mg/dL — AB (ref 65–99)
GLUCOSE: 384 mg/dL — AB (ref 65–99)
POTASSIUM: 3.6 mmol/L (ref 3.5–5.1)
POTASSIUM: 4.3 mmol/L (ref 3.5–5.1)
Potassium: 3.8 mmol/L (ref 3.5–5.1)
SODIUM: 134 mmol/L — AB (ref 135–145)
Sodium: 131 mmol/L — ABNORMAL LOW (ref 135–145)
Sodium: 136 mmol/L (ref 135–145)

## 2016-12-14 LAB — GLUCOSE, CAPILLARY
Glucose-Capillary: >600 mg/dL (ref 65–99)
Glucose-Capillary: 461 mg/dL — ABNORMAL HIGH (ref 65–99)
Glucose-Capillary: 440 mg/dL — ABNORMAL HIGH (ref 65–99)
Glucose-Capillary: 375 mg/dL — ABNORMAL HIGH (ref 65–99)

## 2016-12-14 LAB — HEMOGLOBIN A1C
Hgb A1c MFr Bld: 10 % — ABNORMAL HIGH (ref 4.8–5.6)
Mean Plasma Glucose: 240.3 mg/dL

## 2016-12-14 LAB — CSF CELL COUNT WITH DIFFERENTIAL
Eosinophils, CSF: 0 %
Lymphs, CSF: 92 %
Monocyte-Macrophage-Spinal Fluid: 8 %
RBC COUNT CSF: 100 /mm3 — AB (ref 0–3)
SEGMENTED NEUTROPHILS-CSF: 0 %
Tube #: 3
WBC CSF: 8 /mm3 — AB (ref 0–5)

## 2016-12-14 LAB — PROTEIN AND GLUCOSE, CSF
GLUCOSE CSF: 255 mg/dL — AB (ref 40–70)
Total  Protein, CSF: 31 mg/dL (ref 15–45)

## 2016-12-14 LAB — TSH: TSH: 0.775 u[IU]/mL (ref 0.350–4.500)

## 2016-12-14 LAB — CRYPTOCOCCAL ANTIGEN, CSF: CRYPTO AG: NEGATIVE

## 2016-12-14 LAB — C-REACTIVE PROTEIN

## 2016-12-14 LAB — SEDIMENTATION RATE: Sed Rate: 10 mm/hr (ref 0–20)

## 2016-12-14 MED ORDER — KETOROLAC TROMETHAMINE 15 MG/ML IJ SOLN
15.0000 mg | Freq: Once | INTRAMUSCULAR | Status: AC
  Administered 2016-12-14: 15 mg via INTRAVENOUS
  Filled 2016-12-14: qty 1

## 2016-12-14 MED ORDER — MORPHINE SULFATE (PF) 2 MG/ML IV SOLN
INTRAVENOUS | Status: AC
  Filled 2016-12-14: qty 1

## 2016-12-14 MED ORDER — ONDANSETRON HCL 4 MG/2ML IJ SOLN
4.0000 mg | Freq: Four times a day (QID) | INTRAMUSCULAR | Status: DC | PRN
  Administered 2016-12-16: 4 mg via INTRAVENOUS
  Filled 2016-12-14: qty 2

## 2016-12-14 MED ORDER — ONDANSETRON HCL 4 MG PO TABS: 4.0000 mg | ORAL_TABLET | Freq: Four times a day (QID) | ORAL | Status: DC | PRN

## 2016-12-14 MED ORDER — SODIUM CHLORIDE 0.9 % IV BOLUS (SEPSIS): 1000.0000 mL | Freq: Once | INTRAVENOUS | Status: DC

## 2016-12-14 MED ORDER — INSULIN ASPART 100 UNIT/ML ~~LOC~~ SOLN
4.0000 [IU] | Freq: Three times a day (TID) | SUBCUTANEOUS | Status: DC
  Administered 2016-12-14 – 2016-12-17 (×8): 4 [IU] via SUBCUTANEOUS
  Filled 2016-12-14 (×9): qty 1

## 2016-12-14 MED ORDER — INSULIN ASPART 100 UNIT/ML ~~LOC~~ SOLN
SUBCUTANEOUS | Status: AC
  Filled 2016-12-14: qty 1

## 2016-12-14 MED ORDER — SODIUM CHLORIDE 0.9 % IV SOLN: 1000.0000 mg | INTRAVENOUS | Status: DC

## 2016-12-14 MED ORDER — INSULIN ASPART 100 UNIT/ML ~~LOC~~ SOLN
0.0000 [IU] | Freq: Three times a day (TID) | SUBCUTANEOUS | Status: DC
  Administered 2016-12-14: 12 [IU] via SUBCUTANEOUS
  Filled 2016-12-14: qty 1

## 2016-12-14 MED ORDER — SODIUM CHLORIDE 0.9 % IV SOLN
INTRAVENOUS | Status: DC
  Administered 2016-12-14: 02:00:00 via INTRAVENOUS

## 2016-12-14 MED ORDER — INSULIN GLARGINE 100 UNIT/ML ~~LOC~~ SOLN
12.0000 [IU] | Freq: Every day | SUBCUTANEOUS | Status: DC
  Administered 2016-12-14 – 2016-12-15 (×2): 12 [IU] via SUBCUTANEOUS
  Filled 2016-12-14 (×2): qty 0.12

## 2016-12-14 MED ORDER — MORPHINE SULFATE (PF) 2 MG/ML IV SOLN
2.0000 mg | INTRAVENOUS | Status: AC
  Administered 2016-12-14: 2 mg via INTRAVENOUS
  Filled 2016-12-14: qty 1

## 2016-12-14 MED ORDER — SODIUM CHLORIDE 0.9 % IV SOLN
1000.0000 mg | INTRAVENOUS | Status: AC
  Administered 2016-12-14 – 2016-12-16 (×3): 1000 mg via INTRAVENOUS
  Filled 2016-12-14 (×3): qty 8

## 2016-12-14 MED ORDER — MORPHINE SULFATE (PF) 2 MG/ML IV SOLN
INTRAVENOUS | Status: AC
  Administered 2016-12-14: 2 mg via INTRAVENOUS
  Filled 2016-12-14: qty 1

## 2016-12-14 MED ORDER — INSULIN ASPART 100 UNIT/ML ~~LOC~~ SOLN: 0.0000 [IU] | Freq: Every day | SUBCUTANEOUS | Status: DC

## 2016-12-14 MED ORDER — SODIUM CHLORIDE 0.9 % IV BOLUS (SEPSIS)
1000.0000 mL | Freq: Once | INTRAVENOUS | Status: AC
  Administered 2016-12-14: 1000 mL via INTRAVENOUS

## 2016-12-14 MED ORDER — INSULIN ASPART 100 UNIT/ML ~~LOC~~ SOLN
0.0000 [IU] | Freq: Three times a day (TID) | SUBCUTANEOUS | Status: DC
Start: 2016-12-14 — End: 2016-12-17
  Administered 2016-12-14: 20 [IU] via SUBCUTANEOUS
  Administered 2016-12-15: 15 [IU] via SUBCUTANEOUS
  Administered 2016-12-15: 7 [IU] via SUBCUTANEOUS
  Administered 2016-12-16: 20 [IU] via SUBCUTANEOUS
  Administered 2016-12-16: 15 [IU] via SUBCUTANEOUS
  Administered 2016-12-16: 4 [IU] via SUBCUTANEOUS
  Administered 2016-12-17 (×2): 15 [IU] via SUBCUTANEOUS
  Filled 2016-12-14 (×8): qty 1

## 2016-12-14 MED ORDER — SODIUM CHLORIDE 0.9 % IV SOLN
INTRAVENOUS | Status: DC
  Administered 2016-12-14 – 2016-12-15 (×3): via INTRAVENOUS

## 2016-12-14 MED ORDER — MORPHINE SULFATE (PF) 2 MG/ML IV SOLN
2.0000 mg | INTRAVENOUS | Status: DC | PRN
Start: 2016-12-14 — End: 2016-12-17
  Administered 2016-12-14 – 2016-12-15 (×4): 2 mg via INTRAVENOUS
  Filled 2016-12-14 (×4): qty 1

## 2016-12-14 MED ORDER — DOCUSATE SODIUM 100 MG PO CAPS
100.0000 mg | ORAL_CAPSULE | Freq: Two times a day (BID) | ORAL | Status: DC
  Administered 2016-12-14 – 2016-12-15 (×3): 100 mg via ORAL
  Filled 2016-12-14 (×3): qty 1

## 2016-12-14 MED ORDER — ACETAMINOPHEN 325 MG PO TABS
650.0000 mg | ORAL_TABLET | Freq: Four times a day (QID) | ORAL | Status: DC | PRN
  Administered 2016-12-14 – 2016-12-16 (×4): 650 mg via ORAL
  Filled 2016-12-14 (×4): qty 2

## 2016-12-14 MED ORDER — INSULIN ASPART 100 UNIT/ML ~~LOC~~ SOLN
20.0000 [IU] | Freq: Once | SUBCUTANEOUS | Status: AC
  Administered 2016-12-14: 20 [IU] via SUBCUTANEOUS

## 2016-12-14 MED ORDER — INSULIN ASPART 100 UNIT/ML ~~LOC~~ SOLN
0.0000 [IU] | Freq: Every day | SUBCUTANEOUS | Status: DC
  Administered 2016-12-14: 5 [IU] via SUBCUTANEOUS
  Administered 2016-12-15: 3 [IU] via SUBCUTANEOUS
  Filled 2016-12-14 (×2): qty 1

## 2016-12-14 MED ORDER — ENOXAPARIN SODIUM 40 MG/0.4ML ~~LOC~~ SOLN
40.0000 mg | SUBCUTANEOUS | Status: DC
  Filled 2016-12-14: qty 0.4

## 2016-12-14 MED ORDER — ACETAMINOPHEN 650 MG RE SUPP: 650.0000 mg | Freq: Four times a day (QID) | RECTAL | Status: DC | PRN

## 2016-12-14 NOTE — Progress Notes (Signed)
Patient reports pain to left anterior head 8/10 MD notified and new order received. MD notified that patient is DM and has not had recent CBG. New CBG unable to register via POCT. New order for stat BMP, Lantus, Novolog, and fluid bolus. Patient alert oriented with no other complaints/symptoms at this time.

## 2016-12-14 NOTE — Progress Notes (Signed)
Patient with blood sugar of 461. Per SSI MD notified. Verbal order given per Dr. Amado Coe to administer 12 units of Novolog. DM coordinator consult placed.

## 2016-12-14 NOTE — Progress Notes (Signed)
Puncture site at spine CDI.

## 2016-12-14 NOTE — Progress Notes (Signed)
Per MD give 20 Units SSI and meal coverage. Check BMP x3 starting at 2000 then q 4 hours.

## 2016-12-14 NOTE — Progress Notes (Addendum)
Inpatient Diabetes Program Recommendations  AACE/ADA: New Consensus Statement on Inpatient Glycemic Control (2015)  Target Ranges:  Prepandial:   less than 140 mg/dL      Peak postprandial:   less than 180 mg/dL (1-2 hours)      Critically ill patients:  140 - 180 mg/dL   Results for Anna Singh, Anna Singh (MRN [id number]) as of 12/14/2016 12:39  Ref. Range 12/13/2016 17:14  Hemoglobin A1C Latest Ref Range: 4.8 - 5.6 % 10.0 (H)   Results for Anna Singh, Anna Singh (MRN [id number]) as of 12/14/2016 12:39  Ref. Range 12/14/2016 09:55 12/14/2016 11:22  Glucose-Capillary Latest Ref Range: 65 - 99 mg/dL >696 (HH) 295 (H)    Admit with: Visual disturbance/ Rule Out New Onset MS  History: DM  Home DM Meds: Lantus 12 units daily       Novolog 0-4 units TID  Current Insulin Orders: Lantus 12 units daily      Novolog Moderate Correction Scale/ SSI (0-15 units) TID AC + HS      Note patient getting Solumedrol 1000 mg daily.  CBGs extremely high due to steroids.  Note that Lantus and Novolog started today.  Lantus started at 11am.  Concern that patient's BMET showed CO2 down to 18 and Anion Gap elevated to 14 at 11:30am.     MD- Recommend repeat BMET later this afternoon to make sure patient not trending towards DKA.  May need to increase insulins while patient getting IV steroids.  Plan to speak with patient today to confirm home doses of insulin.    Addendum 2pm- Spoke with patient this afternoon about her home DM regimen.  Takes Lantus 12 units daily + Novolog as follows: Novolog SSI for all CBGs >200 mg/dl and Novolog 1 unit for every 15 grams of Carbohydrates.  States that she usually does not take more than 6 units Novolog at any given time.  Discussed current A1c results with patient (10%).  Patient stated she has been under a lot of stress recently and attributes her poor glucose control to that.  Knows her A1c needs to be closer to 7% or less.  Patient stated she moved to [place] back in August (used to live in [place]) and plans to re-establish care with her old PCP in [place] with [facility].  States she refilled her insulin Rxs prior to her move in August and has plenty of insulin at home.  Note CBGs very high.  Called Dr. Amado Coe to discuss.  Plan is to check a repeat BMET this afternoon.  MD is also increasing Novolog SSI to Resistant scale and starting Novolog 4 units Meal Coverage for pt this afternoon.  If Patient continues to have extremely high CBGs, MD may plan to send pt to ICU for IV Insulin drip while getting steroids.  Discussed above with RN caring for patient.  Also discussed with patient that we will be increasing her insulin doses and that if she feels like we are giving her too much at any given time, she is allowed to ask for less insulin.  If patient requests more insulin than ordered, MD will need to be called for orders.     --Will follow patient during hospitalization--  Ambrose Finland RN, MSN, CDE Diabetes Coordinator Inpatient Glycemic Control Team Team Pager: [phone] (8a-5p)

## 2016-12-14 NOTE — Progress Notes (Signed)
fbs 375

## 2016-12-14 NOTE — H&P (Signed)
Anna Singh is an 40 y.o. female.   Chief Complaint: Visual disturbance HPI: The patient with past medical history of diabetes presents to the emergency department complaining of a headache that began after worsening of her vision. The patient reports a defect in her visual field that appears to be in the mid-eye in the superior aspect of her visual field area this has been an intermittent but gradually worsening problem over the last week and a half. She denies weakness or numbness in her extremities. In the emergency department the patient underwent MRI of the brain which was consistent with multiple sclerosis. Neurology was consulted and the patient was admitted to the hospitalist service for further management.  Past Medical History:  Diagnosis Date  . Diabetes mellitus without complication [facility])     Past Surgical History:  Procedure Laterality Date  . APPENDECTOMY      History reviewed. No pertinent family history. Social History:  reports that she has been smoking Cigarettes.  She has never used smokeless tobacco. She reports that she drinks about 1.2 oz of alcohol per week . She reports that she does not use drugs.  Allergies:  Allergies  Allergen Reactions  . Other Anaphylaxis  . Iodine Swelling    Medications Prior to Admission  Medication Sig Dispense Refill  . insulin aspart (NOVOLOG) 100 UNIT/ML injection Inject 0-4 Units into the skin 3 (three) times daily before meals.     . insulin glargine (LANTUS) 100 UNIT/ML injection Inject 12 Units into the skin at bedtime.       Results for orders placed or performed during the hospital encounter of 12/13/16 (from the past 48 hour(s))  Protime-INR     Status: None   Collection Time: 12/13/16  5:14 PM  Result Value Ref Range   Prothrombin Time 12.6 11.4 - 15.2 seconds   INR 0.95   APTT     Status: None   Collection Time: 12/13/16  5:14 PM  Result Value Ref Range   aPTT 25 24 - 36 seconds  CBC     Status: Abnormal    Collection Time: 12/13/16  5:14 PM  Result Value Ref Range   WBC 6.2 3.6 - 11.0 K/uL   RBC 4.61 3.80 - 5.20 MIL/uL   Hemoglobin 11.8 (L) 12.0 - 16.0 g/dL   HCT 35.8 35.0 - 47.0 %   MCV 77.6 (L) 80.0 - 100.0 fL   MCH 25.5 (L) 26.0 - 34.0 pg   MCHC 32.9 32.0 - 36.0 g/dL   RDW 17.2 (H) 11.5 - 14.5 %   Platelets 221 150 - 440 K/uL  Differential     Status: None   Collection Time: 12/13/16  5:14 PM  Result Value Ref Range   Neutrophils Relative % 53 %   Neutro Abs 3.3 1.4 - 6.5 K/uL   Lymphocytes Relative 36 %   Lymphs Abs 2.2 1.0 - 3.6 K/uL   Monocytes Relative 8 %   Monocytes Absolute 0.5 0.2 - 0.9 K/uL   Eosinophils Relative 2 %   Eosinophils Absolute 0.1 0 - 0.7 K/uL   Basophils Relative 1 %   Basophils Absolute 0.1 0 - 0.1 K/uL  Comprehensive metabolic panel     Status: Abnormal   Collection Time: 12/13/16  5:14 PM  Result Value Ref Range   Sodium 137 135 - 145 mmol/L   Potassium 4.1 3.5 - 5.1 mmol/L   Chloride 101 101 - 111 mmol/L   CO2 27 22 - 32 mmol/L  Glucose, Bld 122 (H) 65 - 99 mg/dL   BUN 27 (H) 6 - 20 mg/dL   Creatinine, Ser 1.09 (H) 0.44 - 1.00 mg/dL   Calcium 9.9 8.9 - 10.3 mg/dL   Total Protein 7.2 6.5 - 8.1 g/dL   Albumin 4.2 3.5 - 5.0 g/dL   AST 27 15 - 41 U/L   ALT 16 14 - 54 U/L   Alkaline Phosphatase 72 38 - 126 U/L   Total Bilirubin 0.1 (L) 0.3 - 1.2 mg/dL   GFR calc non Af Amer >60 >60 mL/min   GFR calc Af Amer >60 >60 mL/min    Comment: (NOTE) The eGFR has been calculated using the CKD EPI equation. This calculation has not been validated in all clinical situations. eGFR's persistently <60 mL/min signify possible Chronic Kidney Disease.    Anion gap 9 5 - 15  Troponin I     Status: None   Collection Time: 12/13/16  5:14 PM  Result Value Ref Range   Troponin I <0.03 <0.03 ng/mL  TSH     Status: None   Collection Time: 12/13/16  5:14 PM  Result Value Ref Range   TSH 0.775 0.350 - 4.500 uIU/mL    Comment: Performed by a 3rd Generation assay  with a functional sensitivity of <=0.01 uIU/mL.   Ct Head Wo Contrast  Result Date: 12/13/2016 CLINICAL DATA:  Throbbing eye pain x1 week with headache and intermittent dizziness this morning. EXAM: CT HEAD WITHOUT CONTRAST TECHNIQUE: Contiguous axial images were obtained from the base of the skull through the vertex without intravenous contrast. COMPARISON:  10/14/2012 FINDINGS: Brain: No evidence of acute infarction, hemorrhage, hydrocephalus, extra-axial collection or mass lesion/mass effect. Vascular: No hyperdense vessel or unexpected calcification. Skull: Normal. Negative for fracture or focal lesion. Sinuses/Orbits: Intact orbits and globes. No retrobulbar are abnormality. The extraocular muscles and optic nerves are symmetric in appearance without enlargement. No lacrimal gland abnormality. No acute sinus disease. Other: Clear bilateral mastoids. IMPRESSION: No acute intracranial abnormality. Electronically Signed   By: Ashley Royalty M.D.   On: 12/13/2016 18:12   Mr Brain W And Wo Contrast  Result Date: 12/13/2016 CLINICAL DATA:  Throbbing eye pain for week, vision changes. Headache today with intermittent dizziness. History of diabetes. EXAM: MRI HEAD AND ORBITS WITHOUT AND WITH CONTRAST TECHNIQUE: Multiplanar, multiecho pulse sequences of the brain and surrounding structures were obtained without and with intravenous contrast. Multiplanar, multiecho pulse sequences of the orbits and surrounding structures were obtained including fat saturation techniques, before and after intravenous contrast administration. CONTRAST:  65m MULTIHANCE GADOBENATE DIMEGLUMINE 529 MG/ML IV SOLN COMPARISON:  CT HEAD December 13, 2016 at 1803 hours FINDINGS: MRI HEAD FINDINGS INTRACRANIAL CONTENTS: No reduced diffusion to suggest acute ischemia or hyperacute demyelination. No susceptibility artifact to suggest hemorrhage. A few nonspecific scattered punctate supratentorial white matter FLAIR T2 hyperintensities. The  ventricles and sulci are normal for patient's age. No suspicious parenchymal signal, masses, mass effect. No abnormal intraparenchymal or extra-axial enhancement. No abnormal extra-axial fluid collections. No extra-axial masses. VASCULAR: Normal major intracranial vascular flow voids present at skull base. SKULL AND UPPER CERVICAL SPINE: No abnormal sellar expansion. No suspicious calvarial bone marrow signal. Craniocervical junction maintained. OTHER: None. MRI ORBITS FINDINGS ORBITS: Faint infiltrative signal LEFT retro-orbital fat surrounding optic nerve sheath complex. LEFT optic nerve sheath demonstrates indistinct signal and enhancement at intra-ocular intraorbital segments, suspected focal LEFT optic nerve involvement, orbital segment. No discrete mass. Ocular globes are intact with normal signal. Lenses are  located. Normal symmetric appearance of the extraocular muscles. No intra-ocular mass, signal abnormality nor abnormal enhancement. Superior ophthalmic veins are not enlarged. VISUALIZED SINUSES: Well-aerated. SOFT TISSUES: Normal. IMPRESSION: MRI brain: 1. Normal MRI of the brain with and without contrast. MRI orbits: 1. 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 Electronically Signed   By: Elon Alas M.D.   On: 12/13/2016 22:28   Mr Rosealee Albee YQ Contrast  Result Date: 12/13/2016 CLINICAL DATA:  Throbbing eye pain for week, vision changes. Headache today with intermittent dizziness. History of diabetes. EXAM: MRI HEAD AND ORBITS WITHOUT AND WITH CONTRAST TECHNIQUE: Multiplanar, multiecho pulse sequences of the brain and surrounding structures were obtained without and with intravenous contrast. Multiplanar, multiecho pulse sequences of the orbits and surrounding structures were obtained including fat saturation techniques, before and after intravenous contrast administration. CONTRAST:  58m MULTIHANCE GADOBENATE DIMEGLUMINE 529 MG/ML IV SOLN COMPARISON:  CT HEAD  December 13, 2016 at 1803 hours FINDINGS: MRI HEAD FINDINGS INTRACRANIAL CONTENTS: No reduced diffusion to suggest acute ischemia or hyperacute demyelination. No susceptibility artifact to suggest hemorrhage. A few nonspecific scattered punctate supratentorial white matter FLAIR T2 hyperintensities. The ventricles and sulci are normal for patient's age. No suspicious parenchymal signal, masses, mass effect. No abnormal intraparenchymal or extra-axial enhancement. No abnormal extra-axial fluid collections. No extra-axial masses. VASCULAR: Normal major intracranial vascular flow voids present at skull base. SKULL AND UPPER CERVICAL SPINE: No abnormal sellar expansion. No suspicious calvarial bone marrow signal. Craniocervical junction maintained. OTHER: None. MRI ORBITS FINDINGS ORBITS: Faint infiltrative signal LEFT retro-orbital fat surrounding optic nerve sheath complex. LEFT optic nerve sheath demonstrates indistinct signal and enhancement at intra-ocular intraorbital segments, suspected focal LEFT optic nerve involvement, orbital segment. No discrete mass. Ocular globes are intact with normal signal. Lenses are located. Normal symmetric appearance of the extraocular muscles. No intra-ocular mass, signal abnormality nor abnormal enhancement. Superior ophthalmic veins are not enlarged. VISUALIZED SINUSES: Well-aerated. SOFT TISSUES: Normal. IMPRESSION: MRI brain: 1. Normal MRI of the brain with and without contrast. MRI orbits: 1. 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 Electronically Signed   By: CElon AlasM.D.   On: 12/13/2016 22:28    Review of Systems  Constitutional: Negative for chills and fever.  HENT: Negative for sore throat and tinnitus.   Eyes: Positive for blurred vision. Negative for redness.  Respiratory: Negative for cough and shortness of breath.   Cardiovascular: Negative for chest pain, palpitations, orthopnea and PND.  Gastrointestinal: Negative  for abdominal pain, diarrhea, nausea and vomiting.  Genitourinary: Negative for dysuria, frequency and urgency.  Musculoskeletal: Negative for joint pain and myalgias.  Skin: Negative for rash.       No lesions  Neurological: Positive for headaches. Negative for dizziness, sensory change, speech change, focal weakness and weakness.  Endo/Heme/Allergies: Does not bruise/bleed easily.       No temperature intolerance  Psychiatric/Behavioral: Negative for depression and suicidal ideas.    Blood pressure 107/65, pulse 75, temperature 97.9 F (36.6 C), temperature source Oral, resp. rate 18, height '5\' 4"'  (1.626 m), weight 59 kg (130 lb), last menstrual period 12/11/2016, SpO2 100 %. Physical Exam  Vitals reviewed. Constitutional: She is oriented to person, place, and time. She appears well-developed and well-nourished. No distress.  HENT:  Head: Normocephalic and atraumatic.  Mouth/Throat: Oropharynx is clear and moist.  Eyes: Pupils are equal, round, and reactive to light. Conjunctivae and EOM are normal. Scleral icterus is present.  Neck: Normal range of  motion. Neck supple. No JVD present. No tracheal deviation present. No thyromegaly present.  Cardiovascular: Normal rate, regular rhythm and normal heart sounds.  Exam reveals no gallop and no friction rub.   No murmur heard. Respiratory: Effort normal and breath sounds normal. No respiratory distress.  GI: Soft. Bowel sounds are normal. She exhibits no distension. There is no tenderness.  Genitourinary:  Genitourinary Comments: Deferred  Musculoskeletal: Normal range of motion. She exhibits no edema.  Lymphadenopathy:    She has no cervical adenopathy.  Neurological: She is alert and oriented to person, place, and time. No cranial nerve deficit. She exhibits normal muscle tone.  Skin: Skin is warm and dry. No rash noted. No erythema.  Psychiatric: She has a normal mood and affect. Judgment and thought content normal.      Assessment/Plan This is a 41 year old female admitted for new onset multiple sclerosis. 1. Multiple sclerosis: The patient received 1 g of Solu-Medrol. We'll continue high-dose steroids per neurology recommendations. 2. Headache: Associated with MS flare; morphine as needed for severe headache 3. Diabetes mellitus type 2: Continue basal insulin therapy. Sliding suicidal hospitalized as well.  4. DVT prophylaxis: Lovenox 5. GI prophylaxis: None The patient is a full code. Time spent on admission orders and patient care approximately 45 minutes  Harrie Foreman, MD 12/14/2016, 2:46 AM

## 2016-12-14 NOTE — Procedures (Signed)
LP Procedure Note:  Patient has been seen and examined.  Chart has been reviewed.  LP is being performed to rule out MS.  Procedure has been explained to patient including risks and benefits.  Consent has been signed by patient and witnessed. A time out was performed.  Blood pressure 111/70, pulse 77, temperature 98 F (36.7 C), temperature source Oral, resp. rate 18, height 5\' 4"  (1.626 m), weight 51.9 kg (114 lb 8 oz), last menstrual period 12/11/2016, SpO2 99 %.   Current Facility-Administered Medications:  .  insulin aspart (novoLOG) 100 UNIT/ML injection, , , ,  .  acetaminophen (TYLENOL) tablet 650 mg, 650 mg, Oral, Q6H PRN, 650 mg at 12/14/16 6237 **OR** acetaminophen (TYLENOL) suppository 650 mg, 650 mg, Rectal, Q6H PRN, Arnaldo Natal, MD .  docusate sodium (COLACE) capsule 100 mg, 100 mg, Oral, BID, Arnaldo Natal, MD, 100 mg at 12/14/16 0148 .  enoxaparin (LOVENOX) injection 40 mg, 40 mg, Subcutaneous, Q24H, Arnaldo Natal, MD .  insulin aspart (novoLOG) injection 0-15 Units, 0-15 Units, Subcutaneous, TID WC, Gouru, Aruna, MD .  insulin aspart (novoLOG) injection 0-5 Units, 0-5 Units, Subcutaneous, QHS, Gouru, Aruna, MD .  insulin glargine (LANTUS) injection 12 Units, 12 Units, Subcutaneous, Daily, Gouru, Aruna, MD, 12 Units at 12/14/16 1123 .  methylPREDNISolone sodium succinate (SOLU-MEDROL) 1,000 mg in sodium chloride 0.9 % 50 mL IVPB, 1,000 mg, Intravenous, Q24H, Gouru, Aruna, MD .  methylPREDNISolone sodium succinate (SOLU-MEDROL) 1,000 mg in sodium chloride 0.9 % 50 mL IVPB, 1,000 mg, Intravenous, Q24H, Gouru, Aruna, MD .  ondansetron (ZOFRAN) tablet 4 mg, 4 mg, Oral, Q6H PRN **OR** ondansetron (ZOFRAN) injection 4 mg, 4 mg, Intravenous, Q6H PRN, Arnaldo Natal, MD .  sodium chloride 0.9 % bolus 1,000 mL, 1,000 mL, Intravenous, Once, Gouru, Aruna, MD   Recent Labs  12/13/16 1714  WBC 6.2  HGB 11.8*  HCT 35.8  PLT 221  INR 0.95    CT of the head:  IMPRESSION: No acute intracranial abnormality.   Patient was placed in the lateral decub/sitting position.  Area was cleaned with betadine and anesthetized with lidocaine.  Under sterile conditions 20G LP needle was placed at approximately L3-4 without difficulty.  Opening pressure was very low and unable to be documented.  Approximately 19cc of clear, colorless fluid was obtained and sent for studies.  No complications were noted.    Thana Farr, MD Neurology [phone] 12/14/2016  11:25 AM

## 2016-12-14 NOTE — Progress Notes (Signed)
Patient ambulated to bed in rm 138. Low falls risk. A&OX4. VSS. In no acute distress at this time. Eating a sandwich tray. Pt says there are blurry spots in L eye but she feels safe to walk. Pt has 4/10 pain in head, does not want pain medicine at this time. Will continue to monitor

## 2016-12-14 NOTE — Progress Notes (Signed)
[facility] - [place] at [facility]   PATIENT NAME: Brian Kocourek    MR#:  [id number]  DATE OF BIRTH:  [birth year]  SUBJECTIVE:  CHIEF COMPLAINT:  Patient is reporting left eye pain radiating to the forehead. Nauseous, dad at bedside Left eye acuity of vision 20/50  REVIEW OF SYSTEMS:  CONSTITUTIONAL: No fever, fatigue or weakness.  EYES: Severe left eye pain, with decreased vision in the left eye EARS, NOSE, AND THROAT: No tinnitus or ear pain.  RESPIRATORY: No cough, shortness of breath, wheezing or hemoptysis.  CARDIOVASCULAR: No chest pain, orthopnea, edema.  GASTROINTESTINAL: No nausea, vomiting, diarrhea or abdominal pain.  GENITOURINARY: No dysuria, hematuria.  ENDOCRINE: No polyuria, nocturia,  HEMATOLOGY: No anemia, easy bruising or bleeding SKIN: No rash or lesion. MUSCULOSKELETAL: No joint pain or arthritis.   NEUROLOGIC: No tingling, numbness, weakness.  PSYCHIATRY: No anxiety or depression.   DRUG ALLERGIES:   Allergies  Allergen Reactions  . Other Anaphylaxis  . Iodine Swelling    VITALS:  Blood pressure 116/69, pulse 89, temperature 97.8 F (36.6 C), temperature source Axillary, resp. rate 18, height  (1.626 m), weight 51.9 kg (114 lb 8 oz), last menstrual period 12/11/2016, SpO2 100 %.  PHYSICAL EXAMINATION:  GENERAL:  41 y.o.-year-old patient lying in the bed with no acute distress.  EYES: Pupils equal, round, reactive to light and accommodation. No scleral icterus. Extraocular muscles intact.  HEENT: Head atraumatic, normocephalic. Oropharynx and nasopharynx clear. Periorbital tenderness is present in the left eye NECK:  Supple, no jugular venous distention. No thyroid enlargement, no tenderness.  LUNGS: Normal breath sounds bilaterally, no wheezing, rales,rhonchi or crepitation. No use of accessory muscles of respiration.  CARDIOVASCULAR: S1, S2 normal. No murmurs, rubs, or gallops.  ABDOMEN: Soft, nontender, nondistended.  Bowel sounds present. No organomegaly or mass.  EXTREMITIES: No pedal edema, cyanosis, or clubbing.  NEUROLOGIC: Cranial nerves II through XII are intact. Muscle strength diminished diffusely. Sensation intact. Gait not checked.  PSYCHIATRIC: The patient is alert and oriented x 3.  SKIN: No obvious rash, lesion, or ulcer.    LABORATORY PANEL:   CBC  Recent Labs Lab 12/13/16 1714  WBC 6.2  HGB 11.8*  HCT 35.8  PLT 221   ------------------------------------------------------------------------------------------------------------------  Chemistries   Recent Labs Lab 12/13/16 1714 12/14/16 1127  NA 137 134*  K 4.1 3.6  CL 101 102  CO2 27 18*  GLUCOSE 122* 463*  BUN 27* 33*  CREATININE 1.09* 1.22*  CALCIUM 9.9 8.6*  AST 27  --   ALT 16  --   ALKPHOS 72  --   BILITOT 0.1*  --    ------------------------------------------------------------------------------------------------------------------  Cardiac Enzymes  Recent Labs Lab 12/13/16 1714  TROPONINI <0.03   ------------------------------------------------------------------------------------------------------------------  RADIOLOGY:  Ct Head Wo Contrast  Result Date: 12/13/2016 CLINICAL DATA:  Throbbing eye pain x1 week with headache and intermittent dizziness this morning. EXAM: CT HEAD WITHOUT CONTRAST TECHNIQUE: Contiguous axial images were obtained from the base of the skull through the vertex without intravenous contrast. COMPARISON:  10/14/2012 FINDINGS: Brain: No evidence of acute infarction, hemorrhage, hydrocephalus, extra-axial collection or mass lesion/mass effect. Vascular: No hyperdense vessel or unexpected calcification. Skull: Normal. Negative for fracture or focal lesion. Sinuses/Orbits: Intact orbits and globes. No retrobulbar are abnormality. The extraocular muscles and optic nerves are symmetric in appearance without enlargement. No lacrimal gland abnormality. No acute sinus disease. Other: Clear  bilateral mastoids. IMPRESSION: No acute intracranial abnormality. Electronically Signed   By: Onalee Hua  Sterling Big M.D.   On: 12/13/2016 18:12   Mr Brain W And Wo Contrast  Result Date: 12/13/2016 CLINICAL DATA:  Throbbing eye pain for week, vision changes. Headache today with intermittent dizziness. History of diabetes. EXAM: MRI HEAD AND ORBITS WITHOUT AND WITH CONTRAST TECHNIQUE: Multiplanar, multiecho pulse sequences of the brain and surrounding structures were obtained without and with intravenous contrast. Multiplanar, multiecho pulse sequences of the orbits and surrounding structures were obtained including fat saturation techniques, before and after intravenous contrast administration. CONTRAST:  10mL MULTIHANCE GADOBENATE DIMEGLUMINE 529 MG/ML IV SOLN COMPARISON:  CT HEAD December 13, 2016 at 1803 hours FINDINGS: MRI HEAD FINDINGS INTRACRANIAL CONTENTS: No reduced diffusion to suggest acute ischemia or hyperacute demyelination. No susceptibility artifact to suggest hemorrhage. A few nonspecific scattered punctate supratentorial white matter FLAIR T2 hyperintensities. The ventricles and sulci are normal for patient's age. No suspicious parenchymal signal, masses, mass effect. No abnormal intraparenchymal or extra-axial enhancement. No abnormal extra-axial fluid collections. No extra-axial masses. VASCULAR: Normal major intracranial vascular flow voids present at skull base. SKULL AND UPPER CERVICAL SPINE: No abnormal sellar expansion. No suspicious calvarial bone marrow signal. Craniocervical junction maintained. OTHER: None. MRI ORBITS FINDINGS ORBITS: Faint infiltrative signal LEFT retro-orbital fat surrounding optic nerve sheath complex. LEFT optic nerve sheath demonstrates indistinct signal and enhancement at intra-ocular intraorbital segments, suspected focal LEFT optic nerve involvement, orbital segment. No discrete mass. Ocular globes are intact with normal signal. Lenses are located. Normal symmetric  appearance of the extraocular muscles. No intra-ocular mass, signal abnormality nor abnormal enhancement. Superior ophthalmic veins are not enlarged. VISUALIZED SINUSES: Well-aerated. SOFT TISSUES: Normal. IMPRESSION: MRI brain: 1. Normal MRI of the brain with and without contrast. MRI orbits: 1. 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 Electronically Signed   By: Awilda Metro M.D.   On: 12/13/2016 22:28   Mr Rockwell [place] ZO Contrast  Result Date: 12/13/2016 CLINICAL DATA:  Throbbing eye pain for week, vision changes. Headache today with intermittent dizziness. History of diabetes. EXAM: MRI HEAD AND ORBITS WITHOUT AND WITH CONTRAST TECHNIQUE: Multiplanar, multiecho pulse sequences of the brain and surrounding structures were obtained without and with intravenous contrast. Multiplanar, multiecho pulse sequences of the orbits and surrounding structures were obtained including fat saturation techniques, before and after intravenous contrast administration. CONTRAST:  10mL MULTIHANCE GADOBENATE DIMEGLUMINE 529 MG/ML IV SOLN COMPARISON:  CT HEAD December 13, 2016 at 1803 hours FINDINGS: MRI HEAD FINDINGS INTRACRANIAL CONTENTS: No reduced diffusion to suggest acute ischemia or hyperacute demyelination. No susceptibility artifact to suggest hemorrhage. A few nonspecific scattered punctate supratentorial white matter FLAIR T2 hyperintensities. The ventricles and sulci are normal for patient's age. No suspicious parenchymal signal, masses, mass effect. No abnormal intraparenchymal or extra-axial enhancement. No abnormal extra-axial fluid collections. No extra-axial masses. VASCULAR: Normal major intracranial vascular flow voids present at skull base. SKULL AND UPPER CERVICAL SPINE: No abnormal sellar expansion. No suspicious calvarial bone marrow signal. Craniocervical junction maintained. OTHER: None. MRI ORBITS FINDINGS ORBITS: Faint infiltrative signal LEFT retro-orbital fat  surrounding optic nerve sheath complex. LEFT optic nerve sheath demonstrates indistinct signal and enhancement at intra-ocular intraorbital segments, suspected focal LEFT optic nerve involvement, orbital segment. No discrete mass. Ocular globes are intact with normal signal. Lenses are located. Normal symmetric appearance of the extraocular muscles. No intra-ocular mass, signal abnormality nor abnormal enhancement. Superior ophthalmic veins are not enlarged. VISUALIZED SINUSES: Well-aerated. SOFT TISSUES: Normal. IMPRESSION: MRI brain: 1. Normal MRI of the brain with and without contrast. MRI orbits:  1. 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 Electronically Signed   By: Awilda Metro M.D.   On: 12/13/2016 22:28    EKG:   Orders placed or performed during the hospital encounter of 12/13/16  . EKG 12-Lead  . EKG 12-Lead  . ED EKG  . ED EKG    ASSESSMENT AND PLAN:    This is a 41 year old female admitted for new onset multiple sclerosis.  1.Acute exacerbation of Multiple sclerosis with left eye intraorbital optic neuritis:  The patient received 1 g of Solu-Medrol.  We'll continue high-dose steroids solum Medrol 1 g IV once daily for 3 more doses per neurology recommendations. Pain management as needed Patient needs an MRI of the C-spine with contrast tomorrow Status post lumbar puncture follow-up with neurology  2. Headache: Associated with MS flare; morphine as needed for severe headache  3. Diabetes mellitus type 2: With hyperglycemia  Monitor blood sugars very closely and hydrate with IV fluids while patient is on high-dose steroids Continue Lantus 12 units and resistant sliding scale insulin Diabetic coordinator is following Check hemoglobin A1c If needed patient will be started on insulin drip for better sugar control   4. DVT prophylaxis: Lovenox  5. GI prophylaxis: None    All the records are reviewed and case discussed with Care  Management/Social Workerr. Management plans discussed with the patient, family and they are in agreement.  CODE STATUS: fc , dad HCPOA  TOTAL TIME TAKING CARE OF THIS PATIENT: 36  minutes.   POSSIBLE D/C IN 2-3 DAYS, DEPENDING ON CLINICAL CONDITION.  Note: This dictation was prepared with Dragon dictation along with smaller phrase technology. Any transcriptional errors that result from this process are unintentional.   Ramonita Lab M.D on 12/14/2016 at 2:59 PM  Between 7am to 6pm - Pager - [phone] After 6pm go to [email or web address] - password [email or web address]  [facility]  Office  [phone]  CC: Primary care physician; Patient, No Pcp Per

## 2016-12-14 NOTE — Consult Note (Signed)
Reason for Consult:Decreased vision Referring Physician: Gouru  CC: Decreased vision left eye  HPI: Anna Singh is an 41 y.o. female who reports that two weeks ago she began to notice pain above her left eye.  Three days ago she began to get a sensation in the upper quadrants of her eye as if a hair was there.  The next day she noted that if she bent over the area of altered vision increased significantly.  On yesterday also has development of a severe headache that was holocranial, throbbing and rated at a 10/10.  With no improvement in the headache patient presented for evaluation.   Reports that about 4 years ago had an episode when she lost use of her RUE for about 2 weeks.  Was told that this was due to her DM.  Has had no intermittent deficits that involved gait, lower extremities or bowel/bladder.    Past Medical History:  Diagnosis Date  . Diabetes mellitus without complication [facility])     Past Surgical History:  Procedure Laterality Date  . APPENDECTOMY      Family history: Two children alive and well.  Uncle with MS  Social History:  reports that she has been smoking Cigarettes.  She has never used smokeless tobacco. She reports that she drinks about 1.2 oz of alcohol per week . She reports that she does not use drugs.  Allergies  Allergen Reactions  . Other Anaphylaxis  . Iodine Swelling    Medications:  I have reviewed the patient's current medications. Prior to Admission:  Prescriptions Prior to Admission  Medication Sig Dispense Refill Last Dose  . insulin aspart (NOVOLOG) 100 UNIT/ML injection Inject 0-4 Units into the skin 3 (three) times daily before meals.      . insulin glargine (LANTUS) 100 UNIT/ML injection Inject 12 Units into the skin at bedtime.       Scheduled: . docusate sodium  100 mg Oral BID  . enoxaparin (LOVENOX) injection  40 mg Subcutaneous Q24H  . insulin aspart      . insulin aspart  0-20 Units Subcutaneous TID WC  . insulin aspart  0-5  Units Subcutaneous QHS  . insulin aspart  4 Units Subcutaneous TID WC  . insulin glargine  12 Units Subcutaneous Daily  . morphine        ROS: History obtained from the patient  General ROS: negative for - chills, fatigue, fever, night sweats, weight gain or weight loss Psychological ROS: negative for - behavioral disorder, hallucinations, memory difficulties, mood swings or suicidal ideation Ophthalmic ROS: as noted in HPI ENT ROS: dizziness Allergy and Immunology ROS: negative for - hives or itchy/watery eyes Hematological and Lymphatic ROS: negative for - bleeding problems, bruising or swollen lymph nodes Endocrine ROS: negative for - galactorrhea, hair pattern changes, polydipsia/polyuria or temperature intolerance Respiratory ROS: negative for - cough, hemoptysis, shortness of breath or wheezing Cardiovascular ROS: negative for - chest pain, dyspnea on exertion, edema or irregular heartbeat Gastrointestinal ROS: negative for - abdominal pain, diarrhea, hematemesis, nausea/vomiting or stool incontinence Genito-Urinary ROS: negative for - dysuria, hematuria, incontinence or urinary frequency/urgency Musculoskeletal ROS: negative for - joint swelling or muscular weakness Neurological ROS: as noted in HPI Dermatological ROS: negative for rash and skin lesion changes  Physical Examination: Blood pressure 111/70, pulse 77, temperature 98 F (36.7 C), temperature source Oral, resp. rate 18, height  (1.626 m), weight 51.9 kg (114 lb 8 oz), last menstrual period 12/11/2016, SpO2 99 %.  HEENT-  Normocephalic, no lesions, without obvious abnormality.  Normal external eye and conjunctiva.  Normal TM's bilaterally.  Normal auditory canals and external ears. Normal external nose, mucus membranes and septum.  Normal pharynx. Cardiovascular- S1, S2 normal, pulses palpable throughout   Lungs- chest clear, no wheezing, rales, normal symmetric air entry Abdomen- soft, non-tender; bowel sounds  normal; no masses,  no organomegaly Extremities- no edema Lymph-no adenopathy palpable Musculoskeletal-no joint tenderness, deformity or swelling Skin-warm and dry, no hyperpigmentation, vitiligo, or suspicious lesions  Neurological Examination   Mental Status: Alert, oriented, thought content appropriate.  Speech fluent without evidence of aphasia.  Able to follow 3 step commands without difficulty. Cranial Nerves: II: Discs flat bilaterally; Visual fields grossly normal, pupils equal, left APD.  VA 20/50 OS and 20/30 OD, uncorrected with handheld Snellens.   III,IV, VI: ptosis not present, extra-ocular motions intact bilaterally V,VII: smile symmetric, facial light touch sensation normal bilaterally VIII: hearing normal bilaterally IX,X: gag reflex present XI: bilateral shoulder shrug XII: midline tongue extension Motor: Right : Upper extremity   5/5    Left:     Upper extremity   5/5  Lower extremity   5/5     Lower extremity   5/5 Tone and bulk:normal tone throughout; no atrophy noted Sensory: Pinprick and light touch intact throughout, bilaterally Deep Tendon Reflexes: 2+ in the upper extremities and absent in the lower extremities Plantars: Right: mute   Left: mute Cerebellar: Mild LUE dysmetria.  Finger to nose intact on the right and normal heel-to-shin testing bilaterally Gait: not tested due to safety concerns   Laboratory Studies:   Basic Metabolic Panel:  Recent Labs Lab 12/13/16 1714  NA 137  K 4.1  CL 101  CO2 27  GLUCOSE 122*  BUN 27*  CREATININE 1.09*  CALCIUM 9.9    Liver Function Tests:  Recent Labs Lab 12/13/16 1714  AST 27  ALT 16  ALKPHOS 72  BILITOT 0.1*  PROT 7.2  ALBUMIN 4.2   No results for input(s): LIPASE, AMYLASE in the last 168 hours. No results for input(s): AMMONIA in the last 168 hours.  CBC:  Recent Labs Lab 12/13/16 1714  WBC 6.2  NEUTROABS 3.3  HGB 11.8*  HCT 35.8  MCV 77.6*  PLT 221    Cardiac  Enzymes:  Recent Labs Lab 12/13/16 1714  TROPONINI <0.03    BNP: Invalid input(s): POCBNP  CBG:  Recent Labs Lab 12/14/16 0954 12/14/16 0955  GLUCAP >600* >600*    Microbiology: No results found for this or any previous visit.  Coagulation Studies:  Recent Labs  12/13/16 1714  LABPROT 12.6  INR 0.95    Urinalysis: No results for input(s): COLORURINE, LABSPEC, PHURINE, GLUCOSEU, HGBUR, BILIRUBINUR, KETONESUR, PROTEINUR, UROBILINOGEN, NITRITE, LEUKOCYTESUR in the last 168 hours.  Invalid input(s): APPERANCEUR  Lipid Panel:  No results found for: CHOL, TRIG, HDL, CHOLHDL, VLDL, LDLCALC  HgbA1C:  Lab Results  Component Value Date   HGBA1C 10.0 (H) 12/13/2016    Urine Drug Screen:  No results found for: LABOPIA, COCAINSCRNUR, LABBENZ, AMPHETMU, THCU, LABBARB  Alcohol Level: No results for input(s): ETH in the last 168 hours.  Other results: EKG: normal sinus rhythm at 98 bpm.  Imaging: Ct Head Wo Contrast  Result Date: 12/13/2016 CLINICAL DATA:  Throbbing eye pain x1 week with headache and intermittent dizziness this morning. EXAM: CT HEAD WITHOUT CONTRAST TECHNIQUE: Contiguous axial images were obtained from the base of the skull through the vertex without intravenous contrast. COMPARISON:  10/14/2012 FINDINGS: Brain: No evidence of acute infarction, hemorrhage, hydrocephalus, extra-axial collection or mass lesion/mass effect. Vascular: No hyperdense vessel or unexpected calcification. Skull: Normal. Negative for fracture or focal lesion. Sinuses/Orbits: Intact orbits and globes. No retrobulbar are abnormality. The extraocular muscles and optic nerves are symmetric in appearance without enlargement. No lacrimal gland abnormality. No acute sinus disease. Other: Clear bilateral mastoids. IMPRESSION: No acute intracranial abnormality. Electronically Signed   By: Tollie Eth M.D.   On: 12/13/2016 18:12   Mr Brain W And Wo Contrast  Result Date: 12/13/2016 CLINICAL  DATA:  Throbbing eye pain for week, vision changes. Headache today with intermittent dizziness. History of diabetes. EXAM: MRI HEAD AND ORBITS WITHOUT AND WITH CONTRAST TECHNIQUE: Multiplanar, multiecho pulse sequences of the brain and surrounding structures were obtained without and with intravenous contrast. Multiplanar, multiecho pulse sequences of the orbits and surrounding structures were obtained including fat saturation techniques, before and after intravenous contrast administration. CONTRAST:  10mL MULTIHANCE GADOBENATE DIMEGLUMINE 529 MG/ML IV SOLN COMPARISON:  CT HEAD December 13, 2016 at 1803 hours FINDINGS: MRI HEAD FINDINGS INTRACRANIAL CONTENTS: No reduced diffusion to suggest acute ischemia or hyperacute demyelination. No susceptibility artifact to suggest hemorrhage. A few nonspecific scattered punctate supratentorial white matter FLAIR T2 hyperintensities. The ventricles and sulci are normal for patient's age. No suspicious parenchymal signal, masses, mass effect. No abnormal intraparenchymal or extra-axial enhancement. No abnormal extra-axial fluid collections. No extra-axial masses. VASCULAR: Normal major intracranial vascular flow voids present at skull base. SKULL AND UPPER CERVICAL SPINE: No abnormal sellar expansion. No suspicious calvarial bone marrow signal. Craniocervical junction maintained. OTHER: None. MRI ORBITS FINDINGS ORBITS: Faint infiltrative signal LEFT retro-orbital fat surrounding optic nerve sheath complex. LEFT optic nerve sheath demonstrates indistinct signal and enhancement at intra-ocular intraorbital segments, suspected focal LEFT optic nerve involvement, orbital segment. No discrete mass. Ocular globes are intact with normal signal. Lenses are located. Normal symmetric appearance of the extraocular muscles. No intra-ocular mass, signal abnormality nor abnormal enhancement. Superior ophthalmic veins are not enlarged. VISUALIZED SINUSES: Well-aerated. SOFT TISSUES:  Normal. IMPRESSION: MRI brain: 1. Normal MRI of the brain with and without contrast. MRI orbits: 1. 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 Electronically Signed   By: Awilda Metro M.D.   On: 12/13/2016 22:28   Mr Rockwell [place] JY Contrast  Result Date: 12/13/2016 CLINICAL DATA:  Throbbing eye pain for week, vision changes. Headache today with intermittent dizziness. History of diabetes. EXAM: MRI HEAD AND ORBITS WITHOUT AND WITH CONTRAST TECHNIQUE: Multiplanar, multiecho pulse sequences of the brain and surrounding structures were obtained without and with intravenous contrast. Multiplanar, multiecho pulse sequences of the orbits and surrounding structures were obtained including fat saturation techniques, before and after intravenous contrast administration. CONTRAST:  10mL MULTIHANCE GADOBENATE DIMEGLUMINE 529 MG/ML IV SOLN COMPARISON:  CT HEAD December 13, 2016 at 1803 hours FINDINGS: MRI HEAD FINDINGS INTRACRANIAL CONTENTS: No reduced diffusion to suggest acute ischemia or hyperacute demyelination. No susceptibility artifact to suggest hemorrhage. A few nonspecific scattered punctate supratentorial white matter FLAIR T2 hyperintensities. The ventricles and sulci are normal for patient's age. No suspicious parenchymal signal, masses, mass effect. No abnormal intraparenchymal or extra-axial enhancement. No abnormal extra-axial fluid collections. No extra-axial masses. VASCULAR: Normal major intracranial vascular flow voids present at skull base. SKULL AND UPPER CERVICAL SPINE: No abnormal sellar expansion. No suspicious calvarial bone marrow signal. Craniocervical junction maintained. OTHER: None. MRI ORBITS FINDINGS ORBITS: Faint infiltrative signal LEFT retro-orbital fat surrounding optic nerve sheath complex.  LEFT optic nerve sheath demonstrates indistinct signal and enhancement at intra-ocular intraorbital segments, suspected focal LEFT optic nerve involvement, orbital  segment. No discrete mass. Ocular globes are intact with normal signal. Lenses are located. Normal symmetric appearance of the extraocular muscles. No intra-ocular mass, signal abnormality nor abnormal enhancement. Superior ophthalmic veins are not enlarged. VISUALIZED SINUSES: Well-aerated. SOFT TISSUES: Normal. IMPRESSION: MRI brain: 1. Normal MRI of the brain with and without contrast. MRI orbits: 1. 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 Electronically Signed   By: Awilda Metro M.D.   On: 12/13/2016 22:28     Assessment/Plan: 41 year old female presenting with complaints of left vision loss.  Symptoms suggestive of optic neuritis.  MRI of the brain and orbits reviewed and shows some enhancement of the left optic sheath. MRI of the brain not diagnostic of MS.  Patient also describes an episode of left arm numbness and weakness a few years ago.  High probability of MS.  Further work up recommended.    Recommendations: 1.  LP 2.  MRI of the cervical spine with and without contrast 3.  Continue Solumedrol at 1g a day for 3 days.  If blood sugars become too difficult to control may discontinue.  Due to poorly controlled DM would not give po taper at discharge.   4.  GI prophylaxis  5.  NMO IgG  6.  Patient to follow up with neurology and ophthalmology at discharge.    Thana Farr, MD Neurology [phone] 12/14/2016, 11:22 AM

## 2016-12-14 NOTE — Procedures (Signed)
LP Procedure Note:  Patient has been seen and examined.  Chart has been reviewed.  LP is being performed to rule out MS.  Procedure has been explained to patient including risks and benefits.  Consent has been signed by patient and witnessed. A time out was performed.  Blood pressure 111/70, pulse 77, temperature 98 F (36.7 C), temperature source Oral, resp. rate 18, height 5' 4" (1.626 m), weight 51.9 kg (114 lb 8 oz), last menstrual period 12/11/2016, SpO2 99 %.   Current Facility-Administered Medications:  .  insulin aspart (novoLOG) 100 UNIT/ML injection, , , ,  .  acetaminophen (TYLENOL) tablet 650 mg, 650 mg, Oral, Q6H PRN, 650 mg at 12/14/16 0627 **OR** acetaminophen (TYLENOL) suppository 650 mg, 650 mg, Rectal, Q6H PRN, Diamond, Michael S, MD .  docusate sodium (COLACE) capsule 100 mg, 100 mg, Oral, BID, Diamond, Michael S, MD, 100 mg at 12/14/16 0148 .  enoxaparin (LOVENOX) injection 40 mg, 40 mg, Subcutaneous, Q24H, Diamond, Michael S, MD .  insulin aspart (novoLOG) injection 0-15 Units, 0-15 Units, Subcutaneous, TID WC, Gouru, Aruna, MD .  insulin aspart (novoLOG) injection 0-5 Units, 0-5 Units, Subcutaneous, QHS, Gouru, Aruna, MD .  insulin glargine (LANTUS) injection 12 Units, 12 Units, Subcutaneous, Daily, Gouru, Aruna, MD, 12 Units at 12/14/16 1123 .  methylPREDNISolone sodium succinate (SOLU-MEDROL) 1,000 mg in sodium chloride 0.9 % 50 mL IVPB, 1,000 mg, Intravenous, Q24H, Gouru, Aruna, MD .  methylPREDNISolone sodium succinate (SOLU-MEDROL) 1,000 mg in sodium chloride 0.9 % 50 mL IVPB, 1,000 mg, Intravenous, Q24H, Gouru, Aruna, MD .  ondansetron (ZOFRAN) tablet 4 mg, 4 mg, Oral, Q6H PRN **OR** ondansetron (ZOFRAN) injection 4 mg, 4 mg, Intravenous, Q6H PRN, Diamond, Michael S, MD .  sodium chloride 0.9 % bolus 1,000 mL, 1,000 mL, Intravenous, Once, Gouru, Aruna, MD   Recent Labs  12/13/16 1714  WBC 6.2  HGB 11.8*  HCT 35.8  PLT 221  INR 0.95    CT of the head:  IMPRESSION: No acute intracranial abnormality.   Patient was placed in the lateral decub/sitting position.  Area was cleaned with betadine and anesthetized with lidocaine.  Under sterile conditions 20G LP needle was placed at approximately L3-4 without difficulty.  Opening pressure was very low and unable to be documented.  Approximately 19cc of clear, colorless fluid was obtained and sent for studies.  No complications were noted.    Leslie Reynolds, MD Neurology [phone] 12/14/2016  11:25 AM  

## 2016-12-14 NOTE — Unmapped (Signed)
Reason for Consult:Decreased vision Referring Physician: Gouru  CC: Decreased vision left eye  HPI: Melissa Banks is an 41 y.o. female who reports that two weeks ago she began to notice pain above her left eye.  Three days ago she began to get a sensation in the upper quadrants of her eye as if a hair was there.  The next day she noted that if she bent over the area of altered vision increased significantly.  On yesterday also has development of a severe headache that was holocranial, throbbing and rated at a 10/10.  With no improvement in the headache patient presented for evaluation.   Reports that about 4 years ago had an episode when she lost use of her RUE for about 2 weeks.  Was told that this was due to her DM.  Has had no intermittent deficits that involved gait, lower extremities or bowel/bladder.    Past Medical History:  Diagnosis Date  . Diabetes mellitus without complication (HCC)     Past Surgical History:  Procedure Laterality Date  . APPENDECTOMY      Family history: Two children alive and well.  Uncle with MS  Social History:  reports that she has been smoking Cigarettes.  She has never used smokeless tobacco. She reports that she drinks about 1.2 oz of alcohol per week . She reports that she does not use drugs.  Allergies  Allergen Reactions  . Other Anaphylaxis  . Iodine Swelling    Medications:  I have reviewed the patient's current medications. Prior to Admission:  Prescriptions Prior to Admission  Medication Sig Dispense Refill Last Dose  . insulin aspart (NOVOLOG) 100 UNIT/ML injection Inject 0-4 Units into the skin 3 (three) times daily before meals.      . insulin glargine (LANTUS) 100 UNIT/ML injection Inject 12 Units into the skin at bedtime.       Scheduled: . docusate sodium  100 mg Oral BID  . enoxaparin (LOVENOX) injection  40 mg Subcutaneous Q24H  . insulin aspart      . insulin aspart  0-20 Units Subcutaneous TID WC  . insulin aspart  0-5  Units Subcutaneous QHS  . insulin aspart  4 Units Subcutaneous TID WC  . insulin glargine  12 Units Subcutaneous Daily  . morphine        ROS: History obtained from the patient  General ROS: negative for - chills, fatigue, fever, night sweats, weight gain or weight loss Psychological ROS: negative for - behavioral disorder, hallucinations, memory difficulties, mood swings or suicidal ideation Ophthalmic ROS: as noted in HPI ENT ROS: dizziness Allergy and Immunology ROS: negative for - hives or itchy/watery eyes Hematological and Lymphatic ROS: negative for - bleeding problems, bruising or swollen lymph nodes Endocrine ROS: negative for - galactorrhea, hair pattern changes, polydipsia/polyuria or temperature intolerance Respiratory ROS: negative for - cough, hemoptysis, shortness of breath or wheezing Cardiovascular ROS: negative for - chest pain, dyspnea on exertion, edema or irregular heartbeat Gastrointestinal ROS: negative for - abdominal pain, diarrhea, hematemesis, nausea/vomiting or stool incontinence Genito-Urinary ROS: negative for - dysuria, hematuria, incontinence or urinary frequency/urgency Musculoskeletal ROS: negative for - joint swelling or muscular weakness Neurological ROS: as noted in HPI Dermatological ROS: negative for rash and skin lesion changes  Physical Examination: Blood pressure 111/70, pulse 77, temperature 98 F (36.7 C), temperature source Oral, resp. rate 18, height 5' 4" (1.626 m), weight 51.9 kg (114 lb 8 oz), last menstrual period 12/11/2016, SpO2 99 %.  HEENT-    Normocephalic, no lesions, without obvious abnormality.  Normal external eye and conjunctiva.  Normal TM's bilaterally.  Normal auditory canals and external ears. Normal external nose, mucus membranes and septum.  Normal pharynx. Cardiovascular- S1, S2 normal, pulses palpable throughout   Lungs- chest clear, no wheezing, rales, normal symmetric air entry Abdomen- soft, non-tender; bowel sounds  normal; no masses,  no organomegaly Extremities- no edema Lymph-no adenopathy palpable Musculoskeletal-no joint tenderness, deformity or swelling Skin-warm and dry, no hyperpigmentation, vitiligo, or suspicious lesions  Neurological Examination   Mental Status: Alert, oriented, thought content appropriate.  Speech fluent without evidence of aphasia.  Able to follow 3 step commands without difficulty. Cranial Nerves: II: Discs flat bilaterally; Visual fields grossly normal, pupils equal, left APD.  VA 20/50 OS and 20/30 OD, uncorrected with handheld Snellens.   III,IV, VI: ptosis not present, extra-ocular motions intact bilaterally V,VII: smile symmetric, facial light touch sensation normal bilaterally VIII: hearing normal bilaterally IX,X: gag reflex present XI: bilateral shoulder shrug XII: midline tongue extension Motor: Right : Upper extremity   5/5    Left:     Upper extremity   5/5  Lower extremity   5/5     Lower extremity   5/5 Tone and bulk:normal tone throughout; no atrophy noted Sensory: Pinprick and light touch intact throughout, bilaterally Deep Tendon Reflexes: 2+ in the upper extremities and absent in the lower extremities Plantars: Right: mute   Left: mute Cerebellar: Mild LUE dysmetria.  Finger to nose intact on the right and normal heel-to-shin testing bilaterally Gait: not tested due to safety concerns   Laboratory Studies:   Basic Metabolic Panel:  Recent Labs Lab 12/13/16 1714  NA 137  K 4.1  CL 101  CO2 27  GLUCOSE 122*  BUN 27*  CREATININE 1.09*  CALCIUM 9.9    Liver Function Tests:  Recent Labs Lab 12/13/16 1714  AST 27  ALT 16  ALKPHOS 72  BILITOT 0.1*  PROT 7.2  ALBUMIN 4.2   No results for input(s): LIPASE, AMYLASE in the last 168 hours. No results for input(s): AMMONIA in the last 168 hours.  CBC:  Recent Labs Lab 12/13/16 1714  WBC 6.2  NEUTROABS 3.3  HGB 11.8*  HCT 35.8  MCV 77.6*  PLT 221    Cardiac  Enzymes:  Recent Labs Lab 12/13/16 1714  TROPONINI <0.03    BNP: Invalid input(s): POCBNP  CBG:  Recent Labs Lab 12/14/16 0954 12/14/16 0955  GLUCAP >600* >600*    Microbiology: No results found for this or any previous visit.  Coagulation Studies:  Recent Labs  12/13/16 1714  LABPROT 12.6  INR 0.95    Urinalysis: No results for input(s): COLORURINE, LABSPEC, PHURINE, GLUCOSEU, HGBUR, BILIRUBINUR, KETONESUR, PROTEINUR, UROBILINOGEN, NITRITE, LEUKOCYTESUR in the last 168 hours.  Invalid input(s): APPERANCEUR  Lipid Panel:  No results found for: CHOL, TRIG, HDL, CHOLHDL, VLDL, LDLCALC  HgbA1C:  Lab Results  Component Value Date   HGBA1C 10.0 (H) 12/13/2016    Urine Drug Screen:  No results found for: LABOPIA, COCAINSCRNUR, LABBENZ, AMPHETMU, THCU, LABBARB  Alcohol Level: No results for input(s): ETH in the last 168 hours.  Other results: EKG: normal sinus rhythm at 98 bpm.  Imaging: Ct Head Wo Contrast  Result Date: 12/13/2016 CLINICAL DATA:  Throbbing eye pain x1 week with headache and intermittent dizziness this morning. EXAM: CT HEAD WITHOUT CONTRAST TECHNIQUE: Contiguous axial images were obtained from the base of the skull through the vertex without intravenous contrast. COMPARISON:    10/14/2012 FINDINGS: Brain: No evidence of acute infarction, hemorrhage, hydrocephalus, extra-axial collection or mass lesion/mass effect. Vascular: No hyperdense vessel or unexpected calcification. Skull: Normal. Negative for fracture or focal lesion. Sinuses/Orbits: Intact orbits and globes. No retrobulbar are abnormality. The extraocular muscles and optic nerves are symmetric in appearance without enlargement. No lacrimal gland abnormality. No acute sinus disease. Other: Clear bilateral mastoids. IMPRESSION: No acute intracranial abnormality. Electronically Signed   By: David  Kwon M.D.   On: 12/13/2016 18:12   Mr Brain W And Wo Contrast  Result Date: 12/13/2016 CLINICAL  DATA:  Throbbing eye pain for week, vision changes. Headache today with intermittent dizziness. History of diabetes. EXAM: MRI HEAD AND ORBITS WITHOUT AND WITH CONTRAST TECHNIQUE: Multiplanar, multiecho pulse sequences of the brain and surrounding structures were obtained without and with intravenous contrast. Multiplanar, multiecho pulse sequences of the orbits and surrounding structures were obtained including fat saturation techniques, before and after intravenous contrast administration. CONTRAST:  10mL MULTIHANCE GADOBENATE DIMEGLUMINE 529 MG/ML IV SOLN COMPARISON:  CT HEAD December 13, 2016 at 1803 hours FINDINGS: MRI HEAD FINDINGS INTRACRANIAL CONTENTS: No reduced diffusion to suggest acute ischemia or hyperacute demyelination. No susceptibility artifact to suggest hemorrhage. A few nonspecific scattered punctate supratentorial white matter FLAIR T2 hyperintensities. The ventricles and sulci are normal for patient's age. No suspicious parenchymal signal, masses, mass effect. No abnormal intraparenchymal or extra-axial enhancement. No abnormal extra-axial fluid collections. No extra-axial masses. VASCULAR: Normal major intracranial vascular flow voids present at skull base. SKULL AND UPPER CERVICAL SPINE: No abnormal sellar expansion. No suspicious calvarial bone marrow signal. Craniocervical junction maintained. OTHER: None. MRI ORBITS FINDINGS ORBITS: Faint infiltrative signal LEFT retro-orbital fat surrounding optic nerve sheath complex. LEFT optic nerve sheath demonstrates indistinct signal and enhancement at intra-ocular intraorbital segments, suspected focal LEFT optic nerve involvement, orbital segment. No discrete mass. Ocular globes are intact with normal signal. Lenses are located. Normal symmetric appearance of the extraocular muscles. No intra-ocular mass, signal abnormality nor abnormal enhancement. Superior ophthalmic veins are not enlarged. VISUALIZED SINUSES: Well-aerated. SOFT TISSUES:  Normal. IMPRESSION: MRI brain: 1. Normal MRI of the brain with and without contrast. MRI orbits: 1. 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 Electronically Signed   By: Courtnay  Bloomer M.D.   On: 12/13/2016 22:28   Mr Orbits W Wo Contrast  Result Date: 12/13/2016 CLINICAL DATA:  Throbbing eye pain for week, vision changes. Headache today with intermittent dizziness. History of diabetes. EXAM: MRI HEAD AND ORBITS WITHOUT AND WITH CONTRAST TECHNIQUE: Multiplanar, multiecho pulse sequences of the brain and surrounding structures were obtained without and with intravenous contrast. Multiplanar, multiecho pulse sequences of the orbits and surrounding structures were obtained including fat saturation techniques, before and after intravenous contrast administration. CONTRAST:  10mL MULTIHANCE GADOBENATE DIMEGLUMINE 529 MG/ML IV SOLN COMPARISON:  CT HEAD December 13, 2016 at 1803 hours FINDINGS: MRI HEAD FINDINGS INTRACRANIAL CONTENTS: No reduced diffusion to suggest acute ischemia or hyperacute demyelination. No susceptibility artifact to suggest hemorrhage. A few nonspecific scattered punctate supratentorial white matter FLAIR T2 hyperintensities. The ventricles and sulci are normal for patient's age. No suspicious parenchymal signal, masses, mass effect. No abnormal intraparenchymal or extra-axial enhancement. No abnormal extra-axial fluid collections. No extra-axial masses. VASCULAR: Normal major intracranial vascular flow voids present at skull base. SKULL AND UPPER CERVICAL SPINE: No abnormal sellar expansion. No suspicious calvarial bone marrow signal. Craniocervical junction maintained. OTHER: None. MRI ORBITS FINDINGS ORBITS: Faint infiltrative signal LEFT retro-orbital fat surrounding optic nerve sheath complex.   LEFT optic nerve sheath demonstrates indistinct signal and enhancement at intra-ocular intraorbital segments, suspected focal LEFT optic nerve involvement, orbital  segment. No discrete mass. Ocular globes are intact with normal signal. Lenses are located. Normal symmetric appearance of the extraocular muscles. No intra-ocular mass, signal abnormality nor abnormal enhancement. Superior ophthalmic veins are not enlarged. VISUALIZED SINUSES: Well-aerated. SOFT TISSUES: Normal. IMPRESSION: MRI brain: 1. Normal MRI of the brain with and without contrast. MRI orbits: 1. 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 Electronically Signed   By: Courtnay  Bloomer M.D.   On: 12/13/2016 22:28     Assessment/Plan: 40 year old female presenting with complaints of left vision loss.  Symptoms suggestive of optic neuritis.  MRI of the brain and orbits reviewed and shows some enhancement of the left optic sheath. MRI of the brain not diagnostic of MS.  Patient also describes an episode of left arm numbness and weakness a few years ago.  High probability of MS.  Further work up recommended.    Recommendations: 1.  LP 2.  MRI of the cervical spine with and without contrast 3.  Continue Solumedrol at 1g a day for 3 days.  If blood sugars become too difficult to control may discontinue.  Due to poorly controlled DM would not give po taper at discharge.   4.  GI prophylaxis  5.  NMO IgG  6.  Patient to follow up with neurology and ophthalmology at discharge.    Leslie Reynolds, MD Neurology [phone] 12/14/2016, 11:22 AM      

## 2016-12-15 ENCOUNTER — Inpatient Hospital Stay: Payer: Medicaid - Out of State

## 2016-12-15 LAB — BASIC METABOLIC PANEL
Anion gap: 10 (ref 5–15)
Anion gap: 8 (ref 5–15)
BUN: 23 mg/dL — ABNORMAL HIGH (ref 6–20)
BUN: 31 mg/dL — ABNORMAL HIGH (ref 6–20)
CHLORIDE: 106 mmol/L (ref 101–111)
CHLORIDE: 107 mmol/L (ref 101–111)
CO2: 19 mmol/L — AB (ref 22–32)
CO2: 22 mmol/L (ref 22–32)
CREATININE: 0.82 mg/dL (ref 0.44–1.00)
CREATININE: 1.06 mg/dL — AB (ref 0.44–1.00)
Calcium: 7.9 mg/dL — ABNORMAL LOW (ref 8.9–10.3)
Calcium: 7.9 mg/dL — ABNORMAL LOW (ref 8.9–10.3)
GFR calc non Af Amer: >60 mL/min (ref >60)
GFR calc non Af Amer: >60 mL/min (ref >60)
Glucose, Bld: 270 mg/dL — ABNORMAL HIGH (ref 65–99)
Glucose, Bld: 321 mg/dL — ABNORMAL HIGH (ref 65–99)
POTASSIUM: 4 mmol/L (ref 3.5–5.1)
POTASSIUM: 4 mmol/L (ref 3.5–5.1)
Sodium: 135 mmol/L (ref 135–145)
Sodium: 137 mmol/L (ref 135–145)

## 2016-12-15 LAB — GLUCOSE, CAPILLARY
Glucose-Capillary: 342 mg/dL — ABNORMAL HIGH (ref 65–99)
Glucose-Capillary: 293 mg/dL — ABNORMAL HIGH (ref 65–99)
Glucose-Capillary: 241 mg/dL — ABNORMAL HIGH (ref 65–99)
Glucose-Capillary: 288 mg/dL — ABNORMAL HIGH (ref 65–99)

## 2016-12-15 LAB — HEMOGLOBIN A1C
Hgb A1c MFr Bld: 9.7 % — ABNORMAL HIGH (ref 4.8–5.6)
Mean Plasma Glucose: 231.69 mg/dL

## 2016-12-15 MED ORDER — INSULIN GLARGINE 100 UNIT/ML ~~LOC~~ SOLN
16.0000 [IU] | Freq: Every day | SUBCUTANEOUS | Status: DC
  Filled 2016-12-15: qty 0.16

## 2016-12-15 MED ORDER — HYDRALAZINE HCL 10 MG PO TABS
10.0000 mg | ORAL_TABLET | Freq: Four times a day (QID) | ORAL | Status: DC | PRN
  Filled 2016-12-15: qty 1

## 2016-12-15 MED ORDER — INSULIN GLARGINE 100 UNIT/ML ~~LOC~~ SOLN
5.0000 [IU] | Freq: Once | SUBCUTANEOUS | Status: DC
  Filled 2016-12-15 (×2): qty 0.05

## 2016-12-15 MED ORDER — OXYCODONE HCL 5 MG PO TABS
5.0000 mg | ORAL_TABLET | ORAL | Status: DC | PRN
  Administered 2016-12-15 – 2016-12-17 (×7): 5 mg via ORAL
  Filled 2016-12-15 (×7): qty 1

## 2016-12-15 MED ORDER — MORPHINE SULFATE (PF) 2 MG/ML IV SOLN
1.0000 mg | Freq: Once | INTRAVENOUS | Status: AC
  Administered 2016-12-15: 1 mg via INTRAVENOUS
  Filled 2016-12-15: qty 1

## 2016-12-15 MED ORDER — MAGNESIUM SULFATE 50 % IJ SOLN
3.0000 g | Freq: Once | INTRAVENOUS | Status: AC
  Administered 2016-12-15: 3 g via INTRAVENOUS
  Filled 2016-12-15: qty 6

## 2016-12-15 MED ORDER — DIPHENHYDRAMINE HCL 25 MG PO CAPS
25.0000 mg | ORAL_CAPSULE | Freq: Once | ORAL | Status: AC
  Administered 2016-12-15: 25 mg via ORAL
  Filled 2016-12-15: qty 1

## 2016-12-15 MED ORDER — PROMETHAZINE HCL 25 MG/ML IJ SOLN
12.5000 mg | Freq: Once | INTRAMUSCULAR | Status: AC
  Administered 2016-12-15: 12.5 mg via INTRAVENOUS
  Filled 2016-12-15: qty 1

## 2016-12-15 MED ORDER — TRAMADOL HCL 50 MG PO TABS
50.0000 mg | ORAL_TABLET | Freq: Once | ORAL | Status: AC
  Administered 2016-12-15: 50 mg via ORAL
  Filled 2016-12-15: qty 1

## 2016-12-15 MED ORDER — GADOBENATE DIMEGLUMINE 529 MG/ML IV SOLN
10.0000 mL | Freq: Once | INTRAVENOUS | Status: AC | PRN
  Administered 2016-12-15: 10 mL via INTRAVENOUS

## 2016-12-15 MED ORDER — POLYETHYLENE GLYCOL 3350 17 G PO PACK
17.0000 g | PACK | Freq: Every day | ORAL | Status: DC
  Administered 2016-12-15 – 2016-12-17 (×3): 17 g via ORAL
  Filled 2016-12-15 (×3): qty 1

## 2016-12-15 NOTE — Progress Notes (Signed)
Patient complaining of back pain at lumbar puncture site. MD notified. Additional pain medicine ordered.

## 2016-12-15 NOTE — Progress Notes (Signed)
Report given to [name] she is to resume care this shift.

## 2016-12-15 NOTE — Consult Note (Signed)
Reason for Consult:Decreased vision Referring Physician: Gouru  CC: Decreased vision left eye  This AM L eye pain resolved. Vision has significantly improved and no scatomas.   Pt does have family Hx of multiple sclerosis( her Uncle)  Past Medical History:  Diagnosis Date  . Diabetes mellitus without complication [facility])     Past Surgical History:  Procedure Laterality Date  . APPENDECTOMY      Family history: Two children alive and well.  Uncle with MS  Social History:  reports that she has been smoking Cigarettes.  She has never used smokeless tobacco. She reports that she drinks about 1.2 oz of alcohol per week . She reports that she does not use drugs.  Allergies  Allergen Reactions  . Other Anaphylaxis  . Iodine Swelling    Medications:  I have reviewed the patient's current medications. Prior to Admission:  Prescriptions Prior to Admission  Medication Sig Dispense Refill Last Dose  . insulin aspart (NOVOLOG) 100 UNIT/ML injection Inject 0-4 Units into the skin 3 (three) times daily before meals.      . insulin glargine (LANTUS) 100 UNIT/ML injection Inject 12 Units into the skin at bedtime.       Scheduled: . enoxaparin (LOVENOX) injection  40 mg Subcutaneous Q24H  . insulin aspart  0-20 Units Subcutaneous TID WC  . insulin aspart  0-5 Units Subcutaneous QHS  . insulin aspart  4 Units Subcutaneous TID WC  . [START ON 12/16/2016] insulin glargine  16 Units Subcutaneous Daily  . insulin glargine  5 Units Subcutaneous Once  . polyethylene glycol  17 g Oral Daily    ROS: History obtained from the patient  General ROS: negative for - chills, fatigue, fever, night sweats, weight gain or weight loss Psychological ROS: negative for - behavioral disorder, hallucinations, memory difficulties, mood swings or suicidal ideation Ophthalmic ROS: as noted in HPI ENT ROS: dizziness Allergy and Immunology ROS: negative for - hives or itchy/watery eyes Hematological and  Lymphatic ROS: negative for - bleeding problems, bruising or swollen lymph nodes Endocrine ROS: negative for - galactorrhea, hair pattern changes, polydipsia/polyuria or temperature intolerance Respiratory ROS: negative for - cough, hemoptysis, shortness of breath or wheezing Cardiovascular ROS: negative for - chest pain, dyspnea on exertion, edema or irregular heartbeat Gastrointestinal ROS: negative for - abdominal pain, diarrhea, hematemesis, nausea/vomiting or stool incontinence Genito-Urinary ROS: negative for - dysuria, hematuria, incontinence or urinary frequency/urgency Musculoskeletal ROS: negative for - joint swelling or muscular weakness Neurological ROS: as noted in HPI Dermatological ROS: negative for rash and skin lesion changes  Physical Examination: Blood pressure (!) 140/91, pulse 87, temperature 97.8 F (36.6 C), temperature source Oral, resp. rate 16, height  (1.626 m), weight 51.9 kg (114 lb 8 oz), last menstrual period 12/11/2016, SpO2 100 %.  HEENT-  Normocephalic, no lesions, without obvious abnormality.  Normal external eye and conjunctiva.  Normal TM's bilaterally.  Normal auditory canals and external ears. Normal external nose, mucus membranes and septum.  Normal pharynx. Cardiovascular- S1, S2 normal, pulses palpable throughout   Lungs- chest clear, no wheezing, rales, normal symmetric air entry Abdomen- soft, non-tender; bowel sounds normal; no masses,  no organomegaly Extremities- no edema Lymph-no adenopathy palpable Musculoskeletal-no joint tenderness, deformity or swelling Skin-warm and dry, no hyperpigmentation, vitiligo, or suspicious lesions  Neurological Examination   Mental Status: Alert, oriented, thought content appropriate.  Speech fluent without evidence of aphasia.  Able to follow 3 step commands without difficulty. Cranial Nerves: II: Discs flat bilaterally;  Visual fields grossly normal, pupils equal, left APD. III,IV, VI: ptosis not  present, extra-ocular motions intact bilaterally V,VII: smile symmetric, facial light touch sensation normal bilaterally VIII: hearing normal bilaterally IX,X: gag reflex present XI: bilateral shoulder shrug XII: midline tongue extension Motor: Right : Upper extremity   5/5    Left:     Upper extremity   5/5  Lower extremity   5/5     Lower extremity   5/5 Tone and bulk:normal tone throughout; no atrophy noted Sensory: Pinprick and light touch intact throughout, bilaterally Deep Tendon Reflexes: 2+ in the upper extremities and absent in the lower extremities Plantars: Right: mute   Left: mute Cerebellar: Mild LUE dysmetria.  Finger to nose intact on the right and normal heel-to-shin testing bilaterally Gait: not tested due to safety concerns   Laboratory Studies:   Basic Metabolic Panel:  Recent Labs Lab 12/14/16 1127 12/14/16 1455 12/14/16 2028 12/15/16 0003 12/15/16 0339  NA 134* 131* 136 135 137  K 3.6 3.8 4.3 4.0 4.0  CL 102 100* 106 106 107  CO2 18* 21* 20* 19* 22  GLUCOSE 463* 407* 384* 270* 321*  BUN 33* 33* 35* 31* 23*  CREATININE 1.22* 1.39* 1.27* 1.06* 0.82  CALCIUM 8.6* 8.4* 8.2* 7.9* 7.9*    Liver Function Tests:  Recent Labs Lab 12/13/16 1714  AST 27  ALT 16  ALKPHOS 72  BILITOT 0.1*  PROT 7.2  ALBUMIN 4.2   No results for input(s): LIPASE, AMYLASE in the last 168 hours. No results for input(s): AMMONIA in the last 168 hours.  CBC:  Recent Labs Lab 12/13/16 1714  WBC 6.2  NEUTROABS 3.3  HGB 11.8*  HCT 35.8  MCV 77.6*  PLT 221    Cardiac Enzymes:  Recent Labs Lab 12/13/16 1714  TROPONINI <0.03    BNP: Invalid input(s): POCBNP  CBG:  Recent Labs Lab 12/14/16 1122 12/14/16 1714 12/14/16 2115 12/15/16 0723 12/15/16 1110  GLUCAP 461* 440* 375* 342* 293*    Microbiology: Results for orders placed or performed during the hospital encounter of 12/13/16  CSF culture     Status: None (Preliminary result)   Collection  Time: 12/14/16 11:21 AM  Result Value Ref Range Status   Specimen Description CSF  Final   Special Requests Normal  Final   Gram Stain   Final    NO ORGANISMS SEEN MANY WBC'S SEEN FEW RBC'S SEEN    Culture   Final    NO GROWTH < 24 HOURS Performed at [facility], [address], [place], [place] [address]    Report Status PENDING  Incomplete    Coagulation Studies:  Recent Labs  12/13/16 1714  LABPROT 12.6  INR 0.95    Urinalysis: No results for input(s): COLORURINE, LABSPEC, PHURINE, GLUCOSEU, HGBUR, BILIRUBINUR, KETONESUR, PROTEINUR, UROBILINOGEN, NITRITE, LEUKOCYTESUR in the last 168 hours.  Invalid input(s): APPERANCEUR  Lipid Panel:  No results found for: CHOL, TRIG, HDL, CHOLHDL, VLDL, LDLCALC  HgbA1C:  Lab Results  Component Value Date   HGBA1C 9.7 (H) 12/15/2016    Urine Drug Screen:  No results found for: LABOPIA, COCAINSCRNUR, LABBENZ, AMPHETMU, THCU, LABBARB  Alcohol Level: No results for input(s): ETH in the last 168 hours.  Other results: EKG: normal sinus rhythm at 98 bpm.  Imaging: Ct Head Wo Contrast  Result Date: 12/13/2016 CLINICAL DATA:  Throbbing eye pain x1 week with headache and intermittent dizziness this morning. EXAM: CT HEAD WITHOUT CONTRAST TECHNIQUE: Contiguous axial images  were obtained from the base of the skull through the vertex without intravenous contrast. COMPARISON:  10/14/2012 FINDINGS: Brain: No evidence of acute infarction, hemorrhage, hydrocephalus, extra-axial collection or mass lesion/mass effect. Vascular: No hyperdense vessel or unexpected calcification. Skull: Normal. Negative for fracture or focal lesion. Sinuses/Orbits: Intact orbits and globes. No retrobulbar are abnormality. The extraocular muscles and optic nerves are symmetric in appearance without enlargement. No lacrimal gland abnormality. No acute sinus disease. Other: Clear bilateral mastoids. IMPRESSION: No acute intracranial abnormality. Electronically  Signed   By: Tollie Eth M.D.   On: 12/13/2016 18:12   Mr Brain W And Wo Contrast  Result Date: 12/13/2016 CLINICAL DATA:  Throbbing eye pain for week, vision changes. Headache today with intermittent dizziness. History of diabetes. EXAM: MRI HEAD AND ORBITS WITHOUT AND WITH CONTRAST TECHNIQUE: Multiplanar, multiecho pulse sequences of the brain and surrounding structures were obtained without and with intravenous contrast. Multiplanar, multiecho pulse sequences of the orbits and surrounding structures were obtained including fat saturation techniques, before and after intravenous contrast administration. CONTRAST:  10mL MULTIHANCE GADOBENATE DIMEGLUMINE 529 MG/ML IV SOLN COMPARISON:  CT HEAD December 13, 2016 at 1803 hours FINDINGS: MRI HEAD FINDINGS INTRACRANIAL CONTENTS: No reduced diffusion to suggest acute ischemia or hyperacute demyelination. No susceptibility artifact to suggest hemorrhage. A few nonspecific scattered punctate supratentorial white matter FLAIR T2 hyperintensities. The ventricles and sulci are normal for patient's age. No suspicious parenchymal signal, masses, mass effect. No abnormal intraparenchymal or extra-axial enhancement. No abnormal extra-axial fluid collections. No extra-axial masses. VASCULAR: Normal major intracranial vascular flow voids present at skull base. SKULL AND UPPER CERVICAL SPINE: No abnormal sellar expansion. No suspicious calvarial bone marrow signal. Craniocervical junction maintained. OTHER: None. MRI ORBITS FINDINGS ORBITS: Faint infiltrative signal LEFT retro-orbital fat surrounding optic nerve sheath complex. LEFT optic nerve sheath demonstrates indistinct signal and enhancement at intra-ocular intraorbital segments, suspected focal LEFT optic nerve involvement, orbital segment. No discrete mass. Ocular globes are intact with normal signal. Lenses are located. Normal symmetric appearance of the extraocular muscles. No intra-ocular mass, signal abnormality nor  abnormal enhancement. Superior ophthalmic veins are not enlarged. VISUALIZED SINUSES: Well-aerated. SOFT TISSUES: Normal. IMPRESSION: MRI brain: 1. Normal MRI of the brain with and without contrast. MRI orbits: 1. 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 Electronically Signed   By: Awilda Metro M.D.   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  Left optic neuritis. EXAM: MRI CERVICAL SPINE WITHOUT AND WITH CONTRAST TECHNIQUE: Multiplanar and multiecho pulse sequences of the cervical spine, to include the craniocervical junction and cervicothoracic junction, were obtained without and with intravenous contrast. CONTRAST:  10mL MULTIHANCE GADOBENATE DIMEGLUMINE 529 MG/ML IV SOLN COMPARISON:  None. FINDINGS: Alignment: Straightening/slight reversal of the normal cervical lordosis. No listhesis. Vertebrae: No fracture or suspicious osseous lesion. Degenerative endplate marrow changes at C5-6 including mild edema. Cord: Faint 3 mm focus of T2 hyperintensity in the dorsal midline cord at C2-3. No cord volume loss or expansion. No abnormal enhancement. Posterior Fossa, vertebral arteries, paraspinal tissues: Unremarkable. Disc levels: C2-3 through C4-5:  Negative. C5-6: Mild-to-moderate disc space narrowing. Disc bulging and uncovertebral spurring result in mild bilateral neural foraminal stenosis without spinal stenosis. C6-7: Mild disc space narrowing. Mild disc bulging and uncovertebral spurring result in mild left neural foraminal stenosis without spinal stenosis. C7-T1:  Negative. IMPRESSION: 1. Small focus of T2 signal abnormality in the cord at C2-3 which  is nonspecific but could reflect demyelinating disease given patient's clinical history and optic neuritis. No abnormal enhancement. 2. 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 Electronically Signed   By: Sebastian Ache M.D.   On: 12/15/2016 11:50   Mr [place] ZO Contrast  Result Date: 12/13/2016 CLINICAL DATA:  Throbbing eye pain for week, vision changes. Headache today with intermittent dizziness. History of diabetes. EXAM: MRI HEAD AND ORBITS WITHOUT AND WITH CONTRAST TECHNIQUE: Multiplanar, multiecho pulse sequences of the brain and surrounding structures were obtained without and with intravenous contrast. Multiplanar, multiecho pulse sequences of the orbits and surrounding structures were obtained including fat saturation techniques, before and after intravenous contrast administration. CONTRAST:  10mL MULTIHANCE GADOBENATE DIMEGLUMINE 529 MG/ML IV SOLN COMPARISON:  CT HEAD December 13, 2016 at 1803 hours FINDINGS: MRI HEAD FINDINGS INTRACRANIAL CONTENTS: No reduced diffusion to suggest acute ischemia or hyperacute demyelination. No susceptibility artifact to suggest hemorrhage. A few nonspecific scattered punctate supratentorial white matter FLAIR T2 hyperintensities. The ventricles and sulci are normal for patient's age. No suspicious parenchymal signal, masses, mass effect. No abnormal intraparenchymal or extra-axial enhancement. No abnormal extra-axial fluid collections. No extra-axial masses. VASCULAR: Normal major intracranial vascular flow voids present at skull base. SKULL AND UPPER CERVICAL SPINE: No abnormal sellar expansion. No suspicious calvarial bone marrow signal. Craniocervical junction maintained. OTHER: None. MRI ORBITS FINDINGS ORBITS: Faint infiltrative signal LEFT retro-orbital fat surrounding optic nerve sheath complex. LEFT optic nerve sheath demonstrates indistinct signal and enhancement at intra-ocular intraorbital segments, suspected focal LEFT optic nerve involvement, orbital segment. No discrete mass. Ocular globes are intact with normal signal. Lenses are located. Normal symmetric appearance of the extraocular muscles. No intra-ocular mass, signal abnormality nor abnormal enhancement.  Superior ophthalmic veins are not enlarged. VISUALIZED SINUSES: Well-aerated. SOFT TISSUES: Normal. IMPRESSION: MRI brain: 1. Normal MRI of the brain with and without contrast. MRI orbits: 1. 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 Electronically Signed   By: Awilda Metro M.D.   On: 12/13/2016 22:28     Assessment/Plan: 41 year old female presenting with complaints of left vision loss.  Symptoms suggestive of optic neuritis.  MRI of the brain and orbits reviewed and shows some enhancement of the left optic sheath. MRI of the brain not diagnostic of MS.  Patient also describes an episode of left arm numbness and weakness a few years ago.  High probability of MS.    This AM L eye pain resolved. Vision has significantly improved and no scatomas.   Pt does have family Hx of multiple sclerosis( her Uncle)  - Normal MRI brain with possible enhancement of L optic nerve which could be consistent with optic neuritis.   - MRI C spine T 2 spine focus of enhancement.    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 Usually red tube.   Since this is sent out lab unclear if it will be sent our prior to Monday so possibly waiting to draw lab prior to discharge.   - Finish 3 days of steroids and possibly d/c with Neurology and Opthalmology follow up  Addendum: Discussed with laboratory and read in  [facility] IgG serum antibody from red tube ordered Test #[id number]  Pauletta Browns    12/15/2016, 12:10 PM

## 2016-12-15 NOTE — Progress Notes (Signed)
Pt describes back pain at 8/10, has improved from 10/10 overnight. Vision changes still present but pain in left eye has subsided to 4/10. Pt noted that when she bends over at the waist her vision changes. Will notify MD.

## 2016-12-15 NOTE — Progress Notes (Addendum)
Inpatient Diabetes Program Recommendations  AACE/ADA: New Consensus Statement on Inpatient Glycemic Control (2015)  Target Ranges:  Prepandial:   less than 140 mg/dL      Peak postprandial:   less than 180 mg/dL (1-2 hours)      Critically ill patients:  140 - 180 mg/dL   Results for Anna Singh, Anna Singh (MRN [id number]) as of 12/15/2016 08:02  Ref. Range 12/14/2016 09:55 12/14/2016 11:22 12/14/2016 17:14 12/14/2016 21:15  Glucose-Capillary Latest Ref Range: 65 - 99 mg/dL >409 (HH) 811 (H) 914 (H) 375 (H)   Results for BATOUL, LIMES (MRN [id number]) as of 12/15/2016 08:02  Ref. Range 12/15/2016 07:23  Glucose-Capillary Latest Ref Range: 65 - 99 mg/dL 086 (H)    Home DM Meds: Lantus 12 units daily                             Novolog 0-4 units TID (Novolog SSI for all CBGs >200 mg/dl and Novolog 1 unit for every 15 grams of Carbohydrates.  States that she usually does not take more than 6 units Novolog at any given time)  Current Insulin Orders: Lantus 12 units daily                                       Novolog Resistant Correction Scale/ SSI (0-20 units) TID AC + HS      Novolog 4 units TID with meals      Note patient getting Solumedrol 1000 mg daily for several doses.  Scheduled to get 2 more doses (today and tomorrow).  CBGs extremely high due to steroids.   MD- Note that BMET this AM shows CO2 is 22 and Anion Gap is 8.  Fasting glucose very high this AM.    Patient has Type 1 DM (since young age).  Normally very Sensitive to Insulin and is prone to Hypoglycemia, however, with the high dose Solumedrol, she is having extreme glucose elevations.  If decision made to increase Lantus today, please increase conservatively.  Please also make sure to decrease Lantus once steroids are stopped.  Recommend the following:  Please consider temporary increase of Lantus to 16 units daily (35% increase)     --Will follow patient during hospitalization--  Ambrose Finland RN,  MSN, CDE Diabetes Coordinator Inpatient Glycemic Control Team Team Pager: [phone] (8a-5p)

## 2016-12-15 NOTE — Progress Notes (Signed)
Spoke with Neuro, Dr. Loretha Brasil placed new orders after speaking with pt. Does not feel this is r/t to lumbar puncture as pain is not changed with activity/standing. Pt agreeable to current plan.  Will continue to monitor

## 2016-12-15 NOTE — Progress Notes (Signed)
Patient ID: Anna Singh, female   DOB: 28-Mar-1975, 41 y.o.   MRN: [id number]  [facility] PROGRESS NOTE  WRIGLEY WINBORNE NFA:[id number] DOB: Apr 27, 1975 DOA: 12/13/2016 PCP: Patient, No Pcp Per  HPI/Subjective: Patient was having back pain since last night. She also had headache last night but the headache is gone today. She describes the pain in her back 6 and a 10 intensity. Left eye vision still impaired and still has a blind spot, but better than yesterday.  Objective: Vitals:   12/15/16 0835 12/15/16 0922  BP: (!) 143/83 (!) 140/91  Pulse: 87 87  Resp: 16   Temp: 97.8 F (36.6 C)   SpO2: 100% 100%    Filed Weights   12/13/16 1700 12/14/16 0500  Weight: 59 kg (130 lb) 51.9 kg (114 lb 8 oz)    ROS: Review of Systems  Constitutional: Negative for chills and fever.  Eyes: Negative for blurred vision.  Respiratory: Negative for cough and shortness of breath.   Cardiovascular: Negative for chest pain.  Gastrointestinal: Negative for abdominal pain, constipation, diarrhea, nausea and vomiting.  Genitourinary: Negative for dysuria.  Musculoskeletal: Positive for back pain. Negative for joint pain.  Neurological: Negative for dizziness and headaches.   Exam: Physical Exam  Constitutional: She is oriented to person, place, and time.  HENT:  Nose: No mucosal edema.  Mouth/Throat: No oropharyngeal exudate or posterior oropharyngeal edema.  Eyes: Pupils are equal, round, and reactive to light. Conjunctivae, EOM and lids are normal.  Neck: No JVD present. Carotid bruit is not present. No edema present. No thyroid mass and no thyromegaly present.  Cardiovascular: S1 normal and S2 normal.  Exam reveals no gallop.   No murmur heard. Pulses:      Dorsalis pedis pulses are 2+ on the right side, and 2+ on the left side.  Respiratory: No respiratory distress. She has no wheezes. She has no rhonchi. She has no rales.  GI: Soft. Bowel sounds are normal. There is no tenderness.   Musculoskeletal:       Right ankle: She exhibits no swelling.       Left ankle: She exhibits no swelling.  Lymphadenopathy:    She has no cervical adenopathy.  Neurological: She is alert and oriented to person, place, and time. No cranial nerve deficit.  Skin: Skin is warm. No rash noted. Nails show no clubbing.  Psychiatric: She has a normal mood and affect.      Data Reviewed: Basic Metabolic Panel:  Recent Labs Lab 12/14/16 1127 12/14/16 1455 12/14/16 2028 12/15/16 0003 12/15/16 0339  NA 134* 131* 136 135 137  K 3.6 3.8 4.3 4.0 4.0  CL 102 100* 106 106 107  CO2 18* 21* 20* 19* 22  GLUCOSE 463* 407* 384* 270* 321*  BUN 33* 33* 35* 31* 23*  CREATININE 1.22* 1.39* 1.27* 1.06* 0.82  CALCIUM 8.6* 8.4* 8.2* 7.9* 7.9*   Liver Function Tests:  Recent Labs Lab 12/13/16 1714  AST 27  ALT 16  ALKPHOS 72  BILITOT 0.1*  PROT 7.2  ALBUMIN 4.2   CBC:  Recent Labs Lab 12/13/16 1714  WBC 6.2  NEUTROABS 3.3  HGB 11.8*  HCT 35.8  MCV 77.6*  PLT 221   Cardiac Enzymes:  Recent Labs Lab 12/13/16 1714  TROPONINI <0.03    CBG:  Recent Labs Lab 12/14/16 1122 12/14/16 1714 12/14/16 2115 12/15/16 0723 12/15/16 1110  GLUCAP 461* 440* 375* 342* 293*    Recent Results (from the past 240  hour(s))  CSF culture     Status: None (Preliminary result)   Collection Time: 12/14/16 11:21 AM  Result Value Ref Range Status   Specimen Description CSF  Final   Special Requests Normal  Final   Gram Stain   Final    NO ORGANISMS SEEN MANY WBC'S SEEN FEW RBC'S SEEN    Culture   Final    NO GROWTH < 24 HOURS Performed at [facility], [address] [place], [place] [address]    Report Status PENDING  Incomplete     Studies: Ct Head Wo Contrast  Result Date: 12/13/2016 CLINICAL DATA:  Throbbing eye pain x1 week with headache and intermittent dizziness this morning. EXAM: CT HEAD WITHOUT CONTRAST TECHNIQUE: Contiguous axial images were obtained from the  base of the skull through the vertex without intravenous contrast. COMPARISON:  10/14/2012 FINDINGS: Brain: No evidence of acute infarction, hemorrhage, hydrocephalus, extra-axial collection or mass lesion/mass effect. Vascular: No hyperdense vessel or unexpected calcification. Skull: Normal. Negative for fracture or focal lesion. Sinuses/Orbits: Intact orbits and globes. No retrobulbar are abnormality. The extraocular muscles and optic nerves are symmetric in appearance without enlargement. No lacrimal gland abnormality. No acute sinus disease. Other: Clear bilateral mastoids. IMPRESSION: No acute intracranial abnormality. Electronically Signed   By: Tollie Eth M.D.   On: 12/13/2016 18:12   Mr Brain W And Wo Contrast  Result Date: 12/13/2016 CLINICAL DATA:  Throbbing eye pain for week, vision changes. Headache today with intermittent dizziness. History of diabetes. EXAM: MRI HEAD AND ORBITS WITHOUT AND WITH CONTRAST TECHNIQUE: Multiplanar, multiecho pulse sequences of the brain and surrounding structures were obtained without and with intravenous contrast. Multiplanar, multiecho pulse sequences of the orbits and surrounding structures were obtained including fat saturation techniques, before and after intravenous contrast administration. CONTRAST:  10mL MULTIHANCE GADOBENATE DIMEGLUMINE 529 MG/ML IV SOLN COMPARISON:  CT HEAD December 13, 2016 at 1803 hours FINDINGS: MRI HEAD FINDINGS INTRACRANIAL CONTENTS: No reduced diffusion to suggest acute ischemia or hyperacute demyelination. No susceptibility artifact to suggest hemorrhage. A few nonspecific scattered punctate supratentorial white matter FLAIR T2 hyperintensities. The ventricles and sulci are normal for patient's age. No suspicious parenchymal signal, masses, mass effect. No abnormal intraparenchymal or extra-axial enhancement. No abnormal extra-axial fluid collections. No extra-axial masses. VASCULAR: Normal major intracranial vascular flow voids  present at skull base. SKULL AND UPPER CERVICAL SPINE: No abnormal sellar expansion. No suspicious calvarial bone marrow signal. Craniocervical junction maintained. OTHER: None. MRI ORBITS FINDINGS ORBITS: Faint infiltrative signal LEFT retro-orbital fat surrounding optic nerve sheath complex. LEFT optic nerve sheath demonstrates indistinct signal and enhancement at intra-ocular intraorbital segments, suspected focal LEFT optic nerve involvement, orbital segment. No discrete mass. Ocular globes are intact with normal signal. Lenses are located. Normal symmetric appearance of the extraocular muscles. No intra-ocular mass, signal abnormality nor abnormal enhancement. Superior ophthalmic veins are not enlarged. VISUALIZED SINUSES: Well-aerated. SOFT TISSUES: Normal. IMPRESSION: MRI brain: 1. Normal MRI of the brain with and without contrast. MRI orbits: 1. 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 Electronically Signed   By: Awilda Metro M.D.   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  Left optic neuritis. EXAM: MRI CERVICAL SPINE WITHOUT AND WITH CONTRAST TECHNIQUE: Multiplanar and multiecho pulse sequences of the cervical spine, to include the craniocervical junction and cervicothoracic junction, were obtained without and with intravenous contrast. CONTRAST:  10mL MULTIHANCE GADOBENATE  DIMEGLUMINE 529 MG/ML IV SOLN COMPARISON:  None. FINDINGS: Alignment: Straightening/slight reversal of the normal cervical lordosis. No listhesis. Vertebrae: No fracture or suspicious osseous lesion. Degenerative endplate marrow changes at C5-6 including mild edema. Cord: Faint 3 mm focus of T2 hyperintensity in the dorsal midline cord at C2-3. No cord volume loss or expansion. No abnormal enhancement. Posterior Fossa, vertebral arteries, paraspinal tissues: Unremarkable. Disc levels: C2-3 through C4-5:  Negative.  C5-6: Mild-to-moderate disc space narrowing. Disc bulging and uncovertebral spurring result in mild bilateral neural foraminal stenosis without spinal stenosis. C6-7: Mild disc space narrowing. Mild disc bulging and uncovertebral spurring result in mild left neural foraminal stenosis without spinal stenosis. C7-T1:  Negative. IMPRESSION: 1. Small focus of T2 signal abnormality in the cord at C2-3 which is nonspecific but could reflect demyelinating disease given patient's clinical history and optic neuritis. No abnormal enhancement. 2. 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 Electronically Signed   By: Sebastian Ache M.D.   On: 12/15/2016 11:50   Mr [place] HX Contrast  Result Date: 12/13/2016 CLINICAL DATA:  Throbbing eye pain for week, vision changes. Headache today with intermittent dizziness. History of diabetes. EXAM: MRI HEAD AND ORBITS WITHOUT AND WITH CONTRAST TECHNIQUE: Multiplanar, multiecho pulse sequences of the brain and surrounding structures were obtained without and with intravenous contrast. Multiplanar, multiecho pulse sequences of the orbits and surrounding structures were obtained including fat saturation techniques, before and after intravenous contrast administration. CONTRAST:  52mL MULTIHANCE GADOBENATE DIMEGLUMINE 529 MG/ML IV SOLN COMPARISON:  CT HEAD December 13, 2016 at 1803 hours FINDINGS: MRI HEAD FINDINGS INTRACRANIAL CONTENTS: No reduced diffusion to suggest acute ischemia or hyperacute demyelination. No susceptibility artifact to suggest hemorrhage. A few nonspecific scattered punctate supratentorial white matter FLAIR T2 hyperintensities. The ventricles and sulci are normal for patient's age. No suspicious parenchymal signal, masses, mass effect. No abnormal intraparenchymal or extra-axial enhancement. No abnormal extra-axial fluid collections. No extra-axial masses. VASCULAR: Normal major intracranial vascular flow voids present at skull base.  SKULL AND UPPER CERVICAL SPINE: No abnormal sellar expansion. No suspicious calvarial bone marrow signal. Craniocervical junction maintained. OTHER: None. MRI ORBITS FINDINGS ORBITS: Faint infiltrative signal LEFT retro-orbital fat surrounding optic nerve sheath complex. LEFT optic nerve sheath demonstrates indistinct signal and enhancement at intra-ocular intraorbital segments, suspected focal LEFT optic nerve involvement, orbital segment. No discrete mass. Ocular globes are intact with normal signal. Lenses are located. Normal symmetric appearance of the extraocular muscles. No intra-ocular mass, signal abnormality nor abnormal enhancement. Superior ophthalmic veins are not enlarged. VISUALIZED SINUSES: Well-aerated. SOFT TISSUES: Normal. IMPRESSION: MRI brain: 1. Normal MRI of the brain with and without contrast. MRI orbits: 1. 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 Electronically Signed   By: Awilda Metro M.D.   On: 12/13/2016 22:28    Scheduled Meds: . enoxaparin (LOVENOX) injection  40 mg Subcutaneous Q24H  . insulin aspart  0-20 Units Subcutaneous TID WC  . insulin aspart  0-5 Units Subcutaneous QHS  . insulin aspart  4 Units Subcutaneous TID WC  . [START ON 12/16/2016] insulin glargine  16 Units Subcutaneous Daily  . insulin glargine  5 Units Subcutaneous Once  . polyethylene glycol  17 g Oral Daily   Continuous Infusions: . methylPREDNISolone (SOLU-MEDROL) injection 1,000 mg (12/14/16 1735)    Assessment/Plan:  1. Optic neuritis with multiple sclerosis. On high-dose Solu-Medrol for 3 days.  Appreciate neurology consultation. 2. Back pain after lumbar puncture. Oral pain medications prescribed. Can  consider blood patch if symptoms don't improve. 3. Constipation start MiraLAX. 4. Type 2 diabetes mellitus with hemoglobin A1c elevated at 9.7. Increase Lantus dose to 17 units today. Sliding scale insulin.  Code Status:     Code Status Orders         Start     Ordered   12/14/16 0110  Full code  Continuous     12/14/16 0109    Code Status History    Date Active Date Inactive Code Status Order ID Comments User Context   This patient has a current code status but no historical code status.     Disposition Plan: evaluate after 3 days of IV steroids for disposition  Consultants:  neurology  Procedures:  Lumbar puncture  Time spent: 28 minutes  Alford Highland  [employer]

## 2016-12-15 NOTE — Progress Notes (Signed)
Pt complains of eye pain, noted with edema around both eyes. Pt also asked if blood patch would be an option at this point because her back pain is not getting any better. MD paged. Dr Renae Gloss will call anesthesia and Neuro to speak with them about procedure.

## 2016-12-16 LAB — GLUCOSE, CAPILLARY
Glucose-Capillary: 383 mg/dL — ABNORMAL HIGH (ref 65–99)
Glucose-Capillary: 309 mg/dL — ABNORMAL HIGH (ref 65–99)
Glucose-Capillary: 148 mg/dL — ABNORMAL HIGH (ref 65–99)
Glucose-Capillary: 113 mg/dL — ABNORMAL HIGH (ref 65–99)

## 2016-12-16 MED ORDER — INSULIN GLARGINE 100 UNIT/ML ~~LOC~~ SOLN: 15.0000 [IU] | Freq: Every day | SUBCUTANEOUS | 11 refills | Status: DC

## 2016-12-16 MED ORDER — PROMETHAZINE HCL 12.5 MG PO TABS: 12.5000 mg | ORAL_TABLET | Freq: Four times a day (QID) | ORAL | 0 refills | Status: DC | PRN

## 2016-12-16 MED ORDER — TRAMADOL HCL 50 MG PO TABS: 50.0000 mg | ORAL_TABLET | Freq: Four times a day (QID) | ORAL | 0 refills | Status: DC | PRN

## 2016-12-16 MED ORDER — INSULIN GLARGINE 100 UNIT/ML ~~LOC~~ SOLN
20.0000 [IU] | Freq: Every day | SUBCUTANEOUS | Status: DC
  Administered 2016-12-16: 20 [IU] via SUBCUTANEOUS
  Filled 2016-12-16 (×2): qty 0.2

## 2016-12-16 NOTE — Progress Notes (Signed)
Patient ID: Anna Singh, female   DOB: 12/13/1975, 41 y.o.   MRN: [id number]   [facility] PROGRESS NOTE  Anna Singh:[id number] DOB: 10-17-1975 DOA: 12/13/2016 PCP: Patient, No Pcp Per  HPI/Subjective: Patient is feeling a little bit better today than yesterday. Still has some visual issues when she is looking at her computer. Still having back pain 5 out of 10 intensity. She is nervous about going home this evening after the last dose of steroids.  Objective: Vitals:   12/15/16 2210 12/16/16 0730  BP: 124/77 (!) 150/88  Pulse: 80 84  Resp: 18 18  Temp: 98.7 F (37.1 C) 98.4 F (36.9 C)  SpO2: 97% 100%    Filed Weights   12/13/16 1700 12/14/16 0500 12/16/16 0500  Weight: 59 kg (130 lb) 51.9 kg (114 lb 8 oz) 51.8 kg (114 lb 3.3 oz)    ROS: Review of Systems  Constitutional: Negative for chills and fever.  Eyes: Negative for blurred vision.  Respiratory: Negative for cough and shortness of breath.   Cardiovascular: Negative for chest pain.  Gastrointestinal: Negative for abdominal pain, constipation, diarrhea, nausea and vomiting.  Genitourinary: Negative for dysuria.  Musculoskeletal: Positive for back pain. Negative for joint pain.  Neurological: Positive for headaches. Negative for dizziness.   Exam: Physical Exam  Constitutional: She is oriented to person, place, and time.  HENT:  Nose: No mucosal edema.  Mouth/Throat: No oropharyngeal exudate or posterior oropharyngeal edema.  Eyes: Pupils are equal, round, and reactive to light. Conjunctivae, EOM and lids are normal.  Neck: No JVD present. Carotid bruit is not present. No edema present. No thyroid mass and no thyromegaly present.  Cardiovascular: S1 normal and S2 normal.  Exam reveals no gallop.   No murmur heard. Pulses:      Dorsalis pedis pulses are 2+ on the right side, and 2+ on the left side.  Respiratory: No respiratory distress. She has no wheezes. She has no rhonchi. She has no rales.   GI: Soft. Bowel sounds are normal. There is no tenderness.  Musculoskeletal:       Right ankle: She exhibits no swelling.       Left ankle: She exhibits no swelling.  Lymphadenopathy:    She has no cervical adenopathy.  Neurological: She is alert and oriented to person, place, and time. No cranial nerve deficit.  Skin: Skin is warm. No rash noted. Nails show no clubbing.  Psychiatric: She has a normal mood and affect.      Data Reviewed: Basic Metabolic Panel:  Recent Labs Lab 12/14/16 1127 12/14/16 1455 12/14/16 2028 12/15/16 0003 12/15/16 0339  NA 134* 131* 136 135 137  K 3.6 3.8 4.3 4.0 4.0  CL 102 100* 106 106 107  CO2 18* 21* 20* 19* 22  GLUCOSE 463* 407* 384* 270* 321*  BUN 33* 33* 35* 31* 23*  CREATININE 1.22* 1.39* 1.27* 1.06* 0.82  CALCIUM 8.6* 8.4* 8.2* 7.9* 7.9*   Liver Function Tests:  Recent Labs Lab 12/13/16 1714  AST 27  ALT 16  ALKPHOS 72  BILITOT 0.1*  PROT 7.2  ALBUMIN 4.2   CBC:  Recent Labs Lab 12/13/16 1714  WBC 6.2  NEUTROABS 3.3  HGB 11.8*  HCT 35.8  MCV 77.6*  PLT 221   Cardiac Enzymes:  Recent Labs Lab 12/13/16 1714  TROPONINI <0.03    CBG:  Recent Labs Lab 12/15/16 1110 12/15/16 1613 12/15/16 2208 12/16/16 0755 12/16/16 1138  GLUCAP 293* 241* 288* 383*  309*    Recent Results (from the past 240 hour(s))  CSF culture     Status: None (Preliminary result)   Collection Time: 12/14/16 11:21 AM  Result Value Ref Range Status   Specimen Description CSF  Final   Special Requests Normal  Final   Gram Stain   Final    NO ORGANISMS SEEN MANY WBC'S SEEN FEW RBC'S SEEN    Culture   Final    NO GROWTH 2 DAYS Performed at [facility], [address], [place], [place] [address]    Report Status PENDING  Incomplete     Studies: Mr Cervical Spine W Wo Contrast  Result Date: 12/15/2016 CLINICAL DATA:  Suspected multiple sclerosis.  Left optic neuritis. EXAM: MRI CERVICAL SPINE WITHOUT AND WITH CONTRAST  TECHNIQUE: Multiplanar and multiecho pulse sequences of the cervical spine, to include the craniocervical junction and cervicothoracic junction, were obtained without and with intravenous contrast. CONTRAST:  10mL MULTIHANCE GADOBENATE DIMEGLUMINE 529 MG/ML IV SOLN COMPARISON:  None. FINDINGS: Alignment: Straightening/slight reversal of the normal cervical lordosis. No listhesis. Vertebrae: No fracture or suspicious osseous lesion. Degenerative endplate marrow changes at C5-6 including mild edema. Cord: Faint 3 mm focus of T2 hyperintensity in the dorsal midline cord at C2-3. No cord volume loss or expansion. No abnormal enhancement. Posterior Fossa, vertebral arteries, paraspinal tissues: Unremarkable. Disc levels: C2-3 through C4-5:  Negative. C5-6: Mild-to-moderate disc space narrowing. Disc bulging and uncovertebral spurring result in mild bilateral neural foraminal stenosis without spinal stenosis. C6-7: Mild disc space narrowing. Mild disc bulging and uncovertebral spurring result in mild left neural foraminal stenosis without spinal stenosis. C7-T1:  Negative. IMPRESSION: 1. Small focus of T2 signal abnormality in the cord at C2-3 which is nonspecific but could reflect demyelinating disease given patient's clinical history and optic neuritis. No abnormal enhancement. 2. 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 Electronically Signed   By: Sebastian Ache M.D.   On: 12/15/2016 11:50    Scheduled Meds: . enoxaparin (LOVENOX) injection  40 mg Subcutaneous Q24H  . insulin aspart  0-20 Units Subcutaneous TID WC  . insulin aspart  0-5 Units Subcutaneous QHS  . insulin aspart  4 Units Subcutaneous TID WC  . insulin glargine  20 Units Subcutaneous Daily  . insulin glargine  5 Units Subcutaneous Once  . polyethylene glycol  17 g Oral Daily   Continuous Infusions: . methylPREDNISolone (SOLU-MEDROL) injection Stopped (12/15/16 2214)    Assessment/Plan:  1. 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 On high-dose Solu-Medrol for 3 days.  Appreciate neurology consultation.  Patient nervous about going home this evening after steroid dose. I will see her tomorrow morning to set up discharge. 2. Back pain after lumbar puncture. Headache improved from yesterday. 3. Constipation start MiraLAX. 4. Type 2 diabetes mellitus with hemoglobin A1c elevated at 9.7. Increase Lantus dose to 20 units today. Sliding scale insulin.  Code Status:     Code Status Orders        Start     Ordered   12/14/16 0110  Full code  Continuous     12/14/16 0109    Code Status History    Date Active Date Inactive Code Status Order ID Comments User Context   This patient has a current code status but no historical code status.     Disposition Plan: Discharge tomorrow morning  Consultants:  neurology  Procedures:  Lumbar puncture  Time  spent: 28 minutes  Loletha Grayer  [employer]

## 2016-12-16 NOTE — Progress Notes (Signed)
Pt requested rx for pain meds at time of discharge until she can get to f/u appt due needing pre-auth for insurance.

## 2016-12-16 NOTE — Consult Note (Signed)
Reason for Consult:Decreased vision Referring Physician: Gouru  CC: Decreased vision left eye Headache from last night resolved with Ultram, benadryl, phenergan, magnesium   Past Medical History:  Diagnosis Date  . Diabetes mellitus without complication [facility]     Past Surgical History:  Procedure Laterality Date  . APPENDECTOMY      Family history: Two children alive and well.  Uncle with MS  Social History:  reports that she has been smoking Cigarettes.  She has never used smokeless tobacco. She reports that she drinks about 1.2 oz of alcohol per week . She reports that she does not use drugs.  Allergies  Allergen Reactions  . Other Anaphylaxis  . Iodine Swelling    Medications:  I have reviewed the patient's current medications. Prior to Admission:  Prescriptions Prior to Admission  Medication Sig Dispense Refill Last Dose  . insulin aspart (NOVOLOG) 100 UNIT/ML injection Inject 0-4 Units into the skin 3 (three) times daily before meals.      . [DISCONTINUED] insulin glargine (LANTUS) 100 UNIT/ML injection Inject 12 Units into the skin at bedtime.       Scheduled: . enoxaparin (LOVENOX) injection  40 mg Subcutaneous Q24H  . insulin aspart  0-20 Units Subcutaneous TID WC  . insulin aspart  0-5 Units Subcutaneous QHS  . insulin aspart  4 Units Subcutaneous TID WC  . insulin glargine  20 Units Subcutaneous Daily  . insulin glargine  5 Units Subcutaneous Once  . polyethylene glycol  17 g Oral Daily    ROS: History obtained from the patient  General ROS: negative for - chills, fatigue, fever, night sweats, weight gain or weight loss Psychological ROS: negative for - behavioral disorder, hallucinations, memory difficulties, mood swings or suicidal ideation Ophthalmic ROS: as noted in HPI ENT ROS: dizziness Allergy and Immunology ROS: negative for - hives or itchy/watery eyes Hematological and Lymphatic ROS: negative for - bleeding problems, bruising or swollen lymph  nodes Endocrine ROS: negative for - galactorrhea, hair pattern changes, polydipsia/polyuria or temperature intolerance Respiratory ROS: negative for - cough, hemoptysis, shortness of breath or wheezing Cardiovascular ROS: negative for - chest pain, dyspnea on exertion, edema or irregular heartbeat Gastrointestinal ROS: negative for - abdominal pain, diarrhea, hematemesis, nausea/vomiting or stool incontinence Genito-Urinary ROS: negative for - dysuria, hematuria, incontinence or urinary frequency/urgency Musculoskeletal ROS: negative for - joint swelling or muscular weakness Neurological ROS: as noted in HPI Dermatological ROS: negative for rash and skin lesion changes  Physical Examination: Blood pressure (!) 150/88, pulse 84, temperature 98.4 F (36.9 C), temperature source Oral, resp. rate 18, height  (1.626 m), weight 51.8 kg (114 lb 3.3 oz), last menstrual period 12/11/2016, SpO2 100 %.  HEENT-  Normocephalic, no lesions, without obvious abnormality.  Normal external eye and conjunctiva.  Normal TM's bilaterally.  Normal auditory canals and external ears. Normal external nose, mucus membranes and septum.  Normal pharynx. Cardiovascular- S1, S2 normal, pulses palpable throughout   Lungs- chest clear, no wheezing, rales, normal symmetric air entry Abdomen- soft, non-tender; bowel sounds normal; no masses,  no organomegaly Extremities- no edema Lymph-no adenopathy palpable Musculoskeletal-no joint tenderness, deformity or swelling Skin-warm and dry, no hyperpigmentation, vitiligo, or suspicious lesions  Neurological Examination   Mental Status: Alert, oriented, thought content appropriate.  Speech fluent without evidence of aphasia.  Able to follow 3 step commands without difficulty. Cranial Nerves: II: Discs flat bilaterally; Visual fields grossly normal, pupils equal, left APD. III,IV, VI: ptosis not present, extra-ocular motions intact bilaterally  V,VII: smile symmetric, facial  light touch sensation normal bilaterally VIII: hearing normal bilaterally IX,X: gag reflex present XI: bilateral shoulder shrug XII: midline tongue extension Motor: Right : Upper extremity   5/5    Left:     Upper extremity   5/5  Lower extremity   5/5     Lower extremity   5/5 Tone and bulk:normal tone throughout; no atrophy noted Sensory: Pinprick and light touch intact throughout, bilaterally Deep Tendon Reflexes: 2+ in the upper extremities and absent in the lower extremities Plantars: Right: mute   Left: mute Cerebellar: Mild LUE dysmetria.  Finger to nose intact on the right and normal heel-to-shin testing bilaterally Gait: not tested due to safety concerns   Laboratory Studies:   Basic Metabolic Panel:  Recent Labs Lab 12/14/16 1127 12/14/16 1455 12/14/16 2028 12/15/16 0003 12/15/16 0339  NA 134* 131* 136 135 137  K 3.6 3.8 4.3 4.0 4.0  CL 102 100* 106 106 107  CO2 18* 21* 20* 19* 22  GLUCOSE 463* 407* 384* 270* 321*  BUN 33* 33* 35* 31* 23*  CREATININE 1.22* 1.39* 1.27* 1.06* 0.82  CALCIUM 8.6* 8.4* 8.2* 7.9* 7.9*    Liver Function Tests:  Recent Labs Lab 12/13/16 1714  AST 27  ALT 16  ALKPHOS 72  BILITOT 0.1*  PROT 7.2  ALBUMIN 4.2   No results for input(s): LIPASE, AMYLASE in the last 168 hours. No results for input(s): AMMONIA in the last 168 hours.  CBC:  Recent Labs Lab 12/13/16 1714  WBC 6.2  NEUTROABS 3.3  HGB 11.8*  HCT 35.8  MCV 77.6*  PLT 221    Cardiac Enzymes:  Recent Labs Lab 12/13/16 1714  TROPONINI <0.03    BNP: Invalid input(s): POCBNP  CBG:  Recent Labs Lab 12/15/16 0723 12/15/16 1110 12/15/16 1613 12/15/16 2208 12/16/16 0755  GLUCAP 342* 293* 241* 288* 383*    Microbiology: Results for orders placed or performed during the hospital encounter of 12/13/16  CSF culture     Status: None (Preliminary result)   Collection Time: 12/14/16 11:21 AM  Result Value Ref Range Status   Specimen Description CSF   Final   Special Requests Normal  Final   Gram Stain   Final    NO ORGANISMS SEEN MANY WBC'S SEEN FEW RBC'S SEEN    Culture   Final    NO GROWTH 2 DAYS Performed at [facility], [address] [place], [place] [address]    Report Status PENDING  Incomplete    Coagulation Studies:  Recent Labs  12/13/16 1714  LABPROT 12.6  INR 0.95    Urinalysis: No results for input(s): COLORURINE, LABSPEC, PHURINE, GLUCOSEU, HGBUR, BILIRUBINUR, KETONESUR, PROTEINUR, UROBILINOGEN, NITRITE, LEUKOCYTESUR in the last 168 hours.  Invalid input(s): APPERANCEUR  Lipid Panel:  No results found for: CHOL, TRIG, HDL, CHOLHDL, VLDL, LDLCALC  HgbA1C:  Lab Results  Component Value Date   HGBA1C 9.7 (H) 12/15/2016    Urine Drug Screen:  No results found for: LABOPIA, COCAINSCRNUR, LABBENZ, AMPHETMU, THCU, LABBARB  Alcohol Level: No results for input(s): ETH in the last 168 hours.  Other results: EKG: normal sinus rhythm at 98 bpm.  Imaging: Mr Cervical Spine W Wo Contrast  Result Date: 12/15/2016 CLINICAL DATA:  Suspected multiple sclerosis.  Left optic neuritis. EXAM: MRI CERVICAL SPINE WITHOUT AND WITH CONTRAST TECHNIQUE: Multiplanar and multiecho pulse sequences of the cervical spine, to include the craniocervical junction and cervicothoracic junction, were obtained without and  with intravenous contrast. CONTRAST:  10mL MULTIHANCE GADOBENATE DIMEGLUMINE 529 MG/ML IV SOLN COMPARISON:  None. FINDINGS: Alignment: Straightening/slight reversal of the normal cervical lordosis. No listhesis. Vertebrae: No fracture or suspicious osseous lesion. Degenerative endplate marrow changes at C5-6 including mild edema. Cord: Faint 3 mm focus of T2 hyperintensity in the dorsal midline cord at C2-3. No cord volume loss or expansion. No abnormal enhancement. Posterior Fossa, vertebral arteries, paraspinal tissues: Unremarkable. Disc levels: C2-3 through C4-5:  Negative. C5-6: Mild-to-moderate disc space  narrowing. Disc bulging and uncovertebral spurring result in mild bilateral neural foraminal stenosis without spinal stenosis. C6-7: Mild disc space narrowing. Mild disc bulging and uncovertebral spurring result in mild left neural foraminal stenosis without spinal stenosis. C7-T1:  Negative. IMPRESSION: 1. Small focus of T2 signal abnormality in the cord at C2-3 which is nonspecific but could reflect demyelinating disease given patient's clinical history and optic neuritis. No abnormal enhancement. 2. 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 Electronically Signed   By: Sebastian Ache M.D.   On: 12/15/2016 11:50     Assessment/Plan: 41 year old female presenting with complaints of left vision loss.  Symptoms suggestive of optic neuritis.  MRI of the brain and orbits reviewed and shows some enhancement of the left optic sheath. MRI of the brain not diagnostic of MS.  Patient also describes an episode of left arm numbness and weakness a few years ago.  High probability of MS.    This AM L eye pain resolved. Vision has significantly improved and no scatomas.   Pt does have family Hx of multiple sclerosis( her Uncle)  - Normal MRI brain with possible enhancement of L optic nerve which could be consistent with optic neuritis.   - MRI C spine T 2 spine focus of enhancement.    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  (Test [phone]) - Crypto negative  - today is day 3 solumedrol after which pt can be d/c  - pt was ambulating without assistance - neuro and opthalmology eval as out pt.    Pauletta Browns    12/16/2016, 11:28 AM

## 2016-12-16 NOTE — Progress Notes (Addendum)
Inpatient Diabetes Program Recommendations  AACE/ADA: New Consensus Statement on Inpatient Glycemic Control (2015)  Target Ranges:  Prepandial:   less than 140 mg/dL      Peak postprandial:   less than 180 mg/dL (1-2 hours)      Critically ill patients:  140 - 180 mg/dL   Results for Anna Singh, Anna Singh (MRN [id number]) as of 12/16/2016 08:53  Ref. Range 12/15/2016 07:23 12/15/2016 11:10 12/15/2016 16:13 12/15/2016 22:08  Glucose-Capillary Latest Ref Range: 65 - 99 mg/dL 244 (H)  19 units Novolog 293 (H)  Refused Novolog 241 (H)  11 units Novolog 288 (H)  3 units Novolog   Results for Anna Singh, Anna Singh (MRN [id number]) as of 12/16/2016 08:53  Ref. Range 12/16/2016 07:55  Glucose-Capillary Latest Ref Range: 65 - 99 mg/dL 116 (H)    Home DM Meds: Lantus 12 units daily Novolog 0-4 units TID (Novolog SSI for all CBGs >200 mg/dl and Novolog 1 unit for every 15 grams of Carbohydrates. States that she usually does not take more than 6 units Novolog at any given time)  Current Insulin Orders: Lantus 20 units daily Novolog Resistant Correction Scale/ SSI (0-20 units) TID AC + HS                                       Novolog 4 units TID with meals      Note patient getting Solumedrol 1000 mg daily for several doses. Scheduled to get 1 more dose today.  CBGs extremely high due to steroids.       Patient has Type 1 DM (since young age).  Normally very Sensitive to Insulin and is prone to Hypoglycemia, however, with the high dose Solumedrol, she is having extreme glucose elevations.  Note Lantus dose increased yesterday, however, patient refused to take increased dose.  Note that Lantus further increased this AM to 20 units daily.  MD- Please make sure to decrease Lantus and Novolog SSI regimen once steroids are stopped.    --Will follow patient during hospitalization--  Ambrose Finland RN, MSN,  CDE Diabetes Coordinator Inpatient Glycemic Control Team Team Pager: [phone] (8a-5p)

## 2016-12-16 NOTE — Discharge Instructions (Signed)
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  Names in follow provider scetion.

## 2016-12-17 LAB — NEUROMYELITIS OPTICA AUTOAB, IGG: NMO-IgG: <1.5 U/mL (ref 0.0–3.0)

## 2016-12-17 LAB — BASIC METABOLIC PANEL
Anion gap: 9 (ref 5–15)
BUN: 19 mg/dL (ref 6–20)
CHLORIDE: 101 mmol/L (ref 101–111)
CO2: 25 mmol/L (ref 22–32)
CREATININE: 0.73 mg/dL (ref 0.44–1.00)
Calcium: 8.3 mg/dL — ABNORMAL LOW (ref 8.9–10.3)
Glucose, Bld: 353 mg/dL — ABNORMAL HIGH (ref 65–99)
POTASSIUM: 4.7 mmol/L (ref 3.5–5.1)
SODIUM: 135 mmol/L (ref 135–145)

## 2016-12-17 LAB — CSF CULTURE W GRAM STAIN
Culture: NO GROWTH
Gram Stain: NONE SEEN
Special Requests: NORMAL

## 2016-12-17 LAB — GLUCOSE, CAPILLARY
Glucose-Capillary: >600 mg/dL (ref 65–99)
Glucose-Capillary: 338 mg/dL — ABNORMAL HIGH (ref 65–99)
Glucose-Capillary: 339 mg/dL — ABNORMAL HIGH (ref 65–99)

## 2016-12-17 LAB — CSF CULTURE

## 2016-12-17 LAB — IGG CSF INDEX
ALBUMIN: 4.1 g/dL (ref 3.5–5.5)
Albumin CSF-mCnc: 13 mg/dL (ref 11–48)
CSF IgG Index: 1.4 — ABNORMAL HIGH (ref 0.0–0.7)
IGG (IMMUNOGLOBIN G), SERUM: 1025 mg/dL (ref 700–1600)
IGG CSF: 4.7 mg/dL (ref 0.0–8.6)
IGG/ALB RATIO, CSF: 0.36 — AB (ref 0.00–0.25)

## 2016-12-17 LAB — MYELIN BASIC PROTEIN, CSF: Myelin Basic Protein: 2.4 ng/mL — ABNORMAL HIGH (ref 0.0–1.2)

## 2016-12-17 MED ORDER — PROMETHAZINE HCL 12.5 MG PO TABS: 12.5000 mg | ORAL_TABLET | Freq: Four times a day (QID) | ORAL | 0 refills | Status: DC | PRN

## 2016-12-17 MED ORDER — OXYCODONE HCL 5 MG PO TABS: 5.0000 mg | ORAL_TABLET | Freq: Four times a day (QID) | ORAL | 0 refills | Status: DC | PRN

## 2016-12-17 MED ORDER — INSULIN GLARGINE 100 UNIT/ML ~~LOC~~ SOLN
15.0000 [IU] | Freq: Every day | SUBCUTANEOUS | Status: DC
  Administered 2016-12-17: 15 [IU] via SUBCUTANEOUS
  Filled 2016-12-17: qty 0.15

## 2016-12-17 NOTE — Progress Notes (Signed)
Patient discharge summary reviewed with verbal understanding. 2 controlled Rxs given upon discharge.Awaiting transportation.

## 2016-12-17 NOTE — Progress Notes (Signed)
Patient ID: Anna Singh, female   DOB: Aug 27, 1975, 41 y.o.   MRN: [id number] [facility] [place] [facility] [name] was admitted to the Hospital on 12/13/2016 and Discharged  12/17/2016 and should be excused from work/school   for 6 days starting 12/13/2016 , may return to work/school on 12/20/2016 without any restrictions.   Alford Highland M.D on 12/17/2016,at 8:21 AM  [facility] [place] [facility]  [phone]

## 2016-12-17 NOTE — Care Management Note (Signed)
Case Management Note  Patient Details  Name: Anna Singh MRN: [id number] Date of Birth: [birth year]  Subjective/Objective:         Admitted to [facility] with the diagnosis of multiple sclerosis. Lives with father, Anna Singh, [phone]). Last seen Dr. Greggory Stallion at [facility] in [place] 11/2015. Prescriptions are filled at [facility] in [place]. No home Health. No skilled facility. No equipment in the home. States she just moved to [place] from [place] 11/2016. States that her [place] will transfer to [facility] 12/27/16.  Father will transport.           Action/Plan: No follow-up needs since Ms. Leiker has a primary care physician and has the means to pay for her prescriptions.    Expected Discharge Date:  12/17/16               Expected Discharge Plan:     In-House Referral:     Discharge planning Services     Post Acute Care Choice:    Choice offered to:     DME Arranged:    DME Agency:     HH Arranged:    HH Agency:     Status of Service:     If discussed at [employer], dates discussed:    Additional Comments:  Gwenette Greet, RN MSN CCM Care Management [phone] 12/17/2016, 10:19 AM

## 2016-12-17 NOTE — Discharge Summary (Signed)
[facility] [place] [facility]   PATIENT NAME: Anna Singh    MR#:  [id number]  DATE OF BIRTH:  [birth year]  DATE OF ADMISSION:  12/13/2016 ADMITTING PHYSICIAN: Arnaldo Natal, MD  DATE OF DISCHARGE: 12/17/2016 12:45 PM  PRIMARY CARE PHYSICIAN: [facility]   ADMISSION DIAGNOSIS:  Optic neuritis, left [H46.9] Intractable headache, unspecified chronicity pattern, unspecified headache type [R51]  DISCHARGE DIAGNOSIS:  Demyelinating disorder  SECONDARY DIAGNOSIS:   Past Medical History:  Diagnosis Date  . Diabetes mellitus without complication [facility])     HOSPITAL COURSE:   1.  Demyelinating disorder. Patient presented with blurred vision of her left eye headache. Patient was seen in consultation by neurology and they recommended 3 days of high-dose IV Solu-Medrol 1000 mg. This could be optic neuritis versus neuromyelitis optica (NMO). Patient had a lumbar puncture.  MRI of the brain was negative. But they comment on left peri-neuritis with possible focal left intraorbital optic neuritis.  MRI of the cervical spine showed a small focus T2 signal abnormality in the cord at C2-C3 which is nonspecific and could represent demyelinating disease given the patient's clinical history and optic neuritis.  Patient will need neurology follow-up as outpatient, ophthalmology follow-up as outpatient and PMD follow-up as outpatient.  Patient's vision had improved during the hospital course. She still has an area where she can't see very well in the upper outer left quadrant. In ana sent off but still pending 2. Back pain after lumbar puncture with headache. We did give the patient medications for headache which improved. She did when a few pills of oxycodone. 3. Type 2 diabetes mellitus with elevated hemoglobin A1c of 9.7. While the patient was on high-dose steroids we did increase the Lantus to 20 units daily. Since the patient finished steroids the patient can go  on a slightly higher dose than at home of Lantus 15 units daily.  DISCHARGE CONDITIONS:   Satisfactory  CONSULTS OBTAINED:  Treatment Team:  Thana Farr, MD  DRUG ALLERGIES:   Allergies  Allergen Reactions  . Other Anaphylaxis  . Iodine Swelling    DISCHARGE MEDICATIONS:   Discharge Medication List as of 12/17/2016 11:04 AM    START taking these medications   Details  oxyCODONE (OXY IR/ROXICODONE) 5 MG immediate release tablet Take 1 tablet (5 mg total) by mouth every 6 (six) hours as needed for moderate pain or breakthrough pain., Starting Mon 12/17/2016, Print      CONTINUE these medications which have CHANGED   Details  insulin glargine (LANTUS) 100 UNIT/ML injection Inject 0.15 mLs (15 Units total) into the skin daily., Starting Sun 12/16/2016, No Print    promethazine (PHENERGAN) 12.5 MG tablet Take 1 tablet (12.5 mg total) by mouth every 6 (six) hours as needed for nausea or vomiting., Starting Mon 12/17/2016, Print      CONTINUE these medications which have NOT CHANGED   Details  insulin aspart (NOVOLOG) 100 UNIT/ML injection Inject 0-4 Units into the skin 3 (three) times daily before meals. , Historical Med         DISCHARGE INSTRUCTIONS:   Follow-up PMD one week Follow-up ophthalmology as outpatient Follow-up neurology as outpatient  If you experience worsening of your admission symptoms, develop shortness of breath, life threatening emergency, suicidal or homicidal thoughts you must seek medical attention immediately by calling 911 or calling your MD immediately  if symptoms less severe.  You Must read complete instructions/literature along with all the possible adverse reactions/side  effects for all the Medicines you take and that have been prescribed to you. Take any new Medicines after you have completely understood and accept all the possible adverse reactions/side effects.   Please note  You were cared for by a hospitalist during your hospital  stay. If you have any questions about your discharge medications or the care you received while you were in the hospital after you are discharged, you can call the unit and asked to speak with the hospitalist on call if the hospitalist that took care of you is not available. Once you are discharged, your primary care physician will handle any further medical issues. Please note that NO REFILLS for any discharge medications will be authorized once you are discharged, as it is imperative that you return to your primary care physician (or establish a relationship with a primary care physician if you do not have one) for your aftercare needs so that they can reassess your need for medications and monitor your lab values.    Today   CHIEF COMPLAINT:   Chief Complaint  Patient presents with  . Eye Problem    HISTORY OF PRESENT ILLNESS:  Anna Singh  is a 41 y.o. female with a known history of Diabetes presents with left eye visual problem   VITAL SIGNS:  Blood pressure (!) 156/92, pulse (!) 59, temperature 98.2 F (36.8 C), temperature source Oral, resp. rate 18, height  (1.626 m), weight 51.8 kg (114 lb 3.3 oz), last menstrual period 12/11/2016, SpO2 99 %.    PHYSICAL EXAMINATION:  GENERAL:  41 y.o.-year-old patient lying in the bed with no acute distress.  EYES: Pupils equal, round, reactive to light and accommodation. No scleral icterus. Extraocular muscles intact.  HEENT: Head atraumatic, normocephalic. Oropharynx and nasopharynx clear.  NECK:  Supple, no jugular venous distention. No thyroid enlargement, no tenderness.  LUNGS: Normal breath sounds bilaterally, no wheezing, rales,rhonchi or crepitation. No use of accessory muscles of respiration.  CARDIOVASCULAR: S1, S2 normal. No murmurs, rubs, or gallops.  ABDOMEN: Soft, non-tender, non-distended. Bowel sounds present. No organomegaly or mass.  EXTREMITIES: No pedal edema, cyanosis, or clubbing.  NEUROLOGIC: Cranial nerves II  through XII are intact. Muscle strength 5/5 in all extremities. Sensation intact. Gait not checked.  PSYCHIATRIC: The patient is alert and oriented x 3.  SKIN: No obvious rash, lesion, or ulcer.   DATA REVIEW:   CBC  Recent Labs Lab 12/13/16 1714  WBC 6.2  HGB 11.8*  HCT 35.8  PLT 221    Chemistries   Recent Labs Lab 12/13/16 1714  12/17/16 0306  NA 137  < > 135  K 4.1  < > 4.7  CL 101  < > 101  CO2 27  < > 25  GLUCOSE 122*  < > 353*  BUN 27*  < > 19  CREATININE 1.09*  < > 0.73  CALCIUM 9.9  < > 8.3*  AST 27  --   --   ALT 16  --   --   ALKPHOS 72  --   --   BILITOT 0.1*  --   --   < > = values in this interval not displayed.  Cardiac Enzymes  Recent Labs Lab 12/13/16 1714  TROPONINI <0.03    Microbiology Results  Results for orders placed or performed during the hospital encounter of 12/13/16  CSF culture     Status: None   Collection Time: 12/14/16 11:21 AM  Result Value Ref Range Status  Specimen Description CSF  Final   Special Requests Normal  Final   Gram Stain   Final    NO ORGANISMS SEEN MANY WBC'S SEEN FEW RBC'S SEEN    Culture   Final    NO GROWTH 3 DAYS Performed at [facility], [address], [place], [place] [address]    Report Status 12/17/2016 FINAL  Final       Management plans discussed with the patient, And she is in agreement.  Patient refused for me to talk in front of her father yesterday.  CODE STATUS:  Code Status History    Date Active Date Inactive Code Status Order ID Comments User Context   12/14/2016  1:10 AM 12/17/2016  4:00 PM Full Code [id number]  Arnaldo Natal, MD ED      TOTAL TIME TAKING CARE OF THIS PATIENT: 32 minutes.    Alford Highland M.D on 12/17/2016 at 4:27 PM  Between 7am to 6pm - Pager - [phone]  After 6pm go to [email or web address] - password [email or web address]  [facility] Office  [phone]  CC: Primary care physician; Dr. Luvenia Starch

## 2016-12-18 LAB — ANA W/REFLEX IF POSITIVE: Anti Nuclear Antibody(ANA): NEGATIVE

## 2016-12-21 LAB — OLIGOCLONAL BANDS, CSF + SERM

## 2016-12-26 ENCOUNTER — Encounter: Payer: Self-pay | Admitting: *Deleted

## 2016-12-26 DIAGNOSIS — E119 Type 2 diabetes mellitus without complications: Secondary | ICD-10-CM | POA: Diagnosis not present

## 2016-12-26 DIAGNOSIS — Y998 Other external cause status: Secondary | ICD-10-CM | POA: Insufficient documentation

## 2016-12-26 DIAGNOSIS — L03113 Cellulitis of right upper limb: Secondary | ICD-10-CM | POA: Diagnosis not present

## 2016-12-26 DIAGNOSIS — W57XXXA Bitten or stung by nonvenomous insect and other nonvenomous arthropods, initial encounter: Secondary | ICD-10-CM | POA: Insufficient documentation

## 2016-12-26 DIAGNOSIS — L089 Local infection of the skin and subcutaneous tissue, unspecified: Secondary | ICD-10-CM | POA: Insufficient documentation

## 2016-12-26 DIAGNOSIS — S60561A Insect bite (nonvenomous) of right hand, initial encounter: Secondary | ICD-10-CM | POA: Diagnosis not present

## 2016-12-26 DIAGNOSIS — Z794 Long term (current) use of insulin: Secondary | ICD-10-CM | POA: Insufficient documentation

## 2016-12-26 DIAGNOSIS — Z79899 Other long term (current) drug therapy: Secondary | ICD-10-CM | POA: Diagnosis not present

## 2016-12-26 DIAGNOSIS — Y92007 Garden or yard of unspecified non-institutional (private) residence as the place of occurrence of the external cause: Secondary | ICD-10-CM | POA: Diagnosis not present

## 2016-12-26 DIAGNOSIS — R2231 Localized swelling, mass and lump, right upper limb: Secondary | ICD-10-CM | POA: Diagnosis present

## 2016-12-26 DIAGNOSIS — Y93H2 Activity, gardening and landscaping: Secondary | ICD-10-CM | POA: Diagnosis not present

## 2016-12-26 NOTE — ED Triage Notes (Signed)
Pt has pain and swelling to right hand. Sx began last night while repotting plants. Pt states she saw a lot of ants.  Right hand red and swollen with itching.  Pt alert.

## 2016-12-27 ENCOUNTER — Emergency Department
Admission: EM | Admit: 2016-12-27 | Discharge: 2016-12-27 | Disposition: A | Discharge status: Home / Self Care | Payer: Medicaid Other | Attending: Emergency Medicine | Admitting: Emergency Medicine

## 2016-12-27 DIAGNOSIS — W57XXXA Bitten or stung by nonvenomous insect and other nonvenomous arthropods, initial encounter: Secondary | ICD-10-CM

## 2016-12-27 DIAGNOSIS — L03113 Cellulitis of right upper limb: Secondary | ICD-10-CM

## 2016-12-27 DIAGNOSIS — S60561A Insect bite (nonvenomous) of right hand, initial encounter: Secondary | ICD-10-CM

## 2016-12-27 DIAGNOSIS — L089 Local infection of the skin and subcutaneous tissue, unspecified: Secondary | ICD-10-CM

## 2016-12-27 LAB — CBC
HEMATOCRIT: 36.3 % (ref 35.0–47.0)
HEMOGLOBIN: 12 g/dL (ref 12.0–16.0)
MCH: 25.5 pg — AB (ref 26.0–34.0)
MCHC: 33.1 g/dL (ref 32.0–36.0)
MCV: 77.1 fL — ABNORMAL LOW (ref 80.0–100.0)
Platelets: 311 10*3/uL (ref 150–440)
RBC: 4.71 MIL/uL (ref 3.80–5.20)
RDW: 16.7 % — AB (ref 11.5–14.5)
WBC: 8.1 10*3/uL (ref 3.6–11.0)

## 2016-12-27 LAB — BASIC METABOLIC PANEL
Anion gap: 9 (ref 5–15)
BUN: 16 mg/dL (ref 6–20)
CALCIUM: 9.3 mg/dL (ref 8.9–10.3)
CHLORIDE: 102 mmol/L (ref 101–111)
CO2: 28 mmol/L (ref 22–32)
CREATININE: 0.75 mg/dL (ref 0.44–1.00)
GFR calc non Af Amer: >60 mL/min (ref >60)
Glucose, Bld: 199 mg/dL — ABNORMAL HIGH (ref 65–99)
Potassium: 3.6 mmol/L (ref 3.5–5.1)
Sodium: 139 mmol/L (ref 135–145)

## 2016-12-27 MED ORDER — CLINDAMYCIN PHOSPHATE 600 MG/50ML IV SOLN
600.0000 mg | Freq: Once | INTRAVENOUS | Status: AC
  Administered 2016-12-27: 600 mg via INTRAVENOUS
  Filled 2016-12-27: qty 50

## 2016-12-27 MED ORDER — KETOROLAC TROMETHAMINE 30 MG/ML IJ SOLN
30.0000 mg | Freq: Once | INTRAMUSCULAR | Status: AC
  Administered 2016-12-27: 30 mg via INTRAVENOUS
  Filled 2016-12-27: qty 1

## 2016-12-27 MED ORDER — TRAMADOL HCL 50 MG PO TABS: 50.0000 mg | ORAL_TABLET | Freq: Four times a day (QID) | ORAL | 0 refills | Status: DC | PRN

## 2016-12-27 MED ORDER — CLINDAMYCIN HCL 300 MG PO CAPS: 300.0000 mg | ORAL_CAPSULE | Freq: Three times a day (TID) | ORAL | 0 refills | Status: AC

## 2016-12-27 MED ORDER — DIPHENHYDRAMINE HCL 50 MG/ML IJ SOLN
25.0000 mg | Freq: Once | INTRAMUSCULAR | Status: AC
  Administered 2016-12-27: 25 mg via INTRAVENOUS
  Filled 2016-12-27: qty 1

## 2016-12-27 NOTE — Discharge Instructions (Signed)
Please ensure that she would take the antibiotics for the cellulitis. Please follow-up with your primary care physician or with the acute care clinic for repeat evaluation of the hand. Should the redness or swelling get worse or should you notice any blisters, worsening pain please return to the emergency department for repeat evaluation as well as admission for IV antibiotics.

## 2016-12-27 NOTE — ED Provider Notes (Signed)
[facility] Emergency Department Provider Note   ____________________________________________   First MD Initiated Contact with Patient 12/27/16 0022     (approximate)  I have reviewed the triage vital signs and the nursing notes.   HISTORY  Chief Complaint Hand Pain; Rash; and Insect Bite    HPI Anna Singh is a 41 y.o. female who comes into the hospital today with some pain and swelling in her right hand. The patient reports that she was attacked by ants. She was transferring her plants from outside to inside and reports that she was doing it at night. She had seen some ants during the day but didn't think much of it. This occurred last night. It was mainly just her thumb that was swollen but she states that this morning it was her thumb and her hand near her index finger. She's been taking some Benadryl and using cortisone but when she went to sleep around 6 and woke back up she stated that the swelling was worse. It seems to be the majority of her hand and the redness and swelling seems to be going up her right arm. The patient states her pain is 8 out of 10 in intensity currently. Sharp itchy and burning. The patient came in and she is concerned it may be infected. She is here for evaluation.   Past Medical History:  Diagnosis Date  . Diabetes mellitus without complication [facility])     Patient Active Problem List   Diagnosis Date Noted  . MS (multiple sclerosis) (HCC) 12/14/2016    Past Surgical History:  Procedure Laterality Date  . APPENDECTOMY      Prior to Admission medications   Medication Sig Start Date End Date Taking? Authorizing Provider  insulin aspart (NOVOLOG) 100 UNIT/ML injection Inject 0-4 Units into the skin 3 (three) times daily before meals.    Yes [provider]  insulin glargine (LANTUS) 100 UNIT/ML injection Inject 0.15 mLs (15 Units total) into the skin daily. 12/16/16  Yes Wieting, Richard, MD  oxyCODONE (OXY  IR/ROXICODONE) 5 MG immediate release tablet Take 1 tablet (5 mg total) by mouth every 6 (six) hours as needed for moderate pain or breakthrough pain. 12/17/16  Yes Wieting, Richard, MD  promethazine (PHENERGAN) 12.5 MG tablet Take 1 tablet (12.5 mg total) by mouth every 6 (six) hours as needed for nausea or vomiting. 12/17/16  Yes Wieting, Richard, MD  clindamycin (CLEOCIN) 300 MG capsule Take 1 capsule (300 mg total) by mouth 3 (three) times daily. 12/27/16 01/06/17  Rebecka Apley, MD  traMADol (ULTRAM) 50 MG tablet Take 1 tablet (50 mg total) by mouth every 6 (six) hours as needed. 12/27/16   Rebecka Apley, MD    Allergies Other and Iodine  No family history on file.  Social History Social History  Substance Use Topics  . Smoking status: Light Tobacco Smoker    Types: Cigarettes  . Smokeless tobacco: Never Used     Comment: 2 per day  . Alcohol use 1.2 oz/week    2 Cans of beer per week    Review of Systems  Constitutional: No fever/chills Eyes: No visual changes. ENT: No sore throat. Cardiovascular: Denies chest pain. Respiratory: Denies shortness of breath. Gastrointestinal: No abdominal pain.  No nausea, no vomiting.  No diarrhea.  No constipation. Genitourinary: Negative for dysuria. Musculoskeletal: Right hand swelling Skin: Right hand redness Neurological: Negative for headaches, focal weakness or numbness.   ____________________________________________   PHYSICAL EXAM:  VITAL SIGNS: ED Triage Vitals  Enc Vitals Group     BP 12/26/16 2231 (!) 122/92     Pulse Rate 12/26/16 2231 91     Resp 12/26/16 2231 18     Temp 12/26/16 2231 98.8 F (37.1 C)     Temp Source 12/26/16 2231 Oral     SpO2 12/26/16 2231 99 %     Weight 12/26/16 2229 114 lb (51.7 kg)     Height 12/26/16 2229 5\' 4"  (1.626 m)     Head Circumference --      Peak Flow --      Pain Score --      Pain Loc --      Pain Edu? --      Excl. in GC? --     Constitutional: Alert and  oriented. Well appearing and in Moderate distress. Eyes: Conjunctivae are normal. PERRL. EOMI. Head: Atraumatic. Nose: No congestion/rhinnorhea. Mouth/Throat: Mucous membranes are moist.  Oropharynx non-erythematous. Cardiovascular: Normal rate, regular rhythm. Grossly normal heart sounds.  Good peripheral circulation. Respiratory: Normal respiratory effort.  No retractions. Lungs CTAB. Gastrointestinal: Soft and nontender. No distention. Positive bowel sounds Musculoskeletal: swelling to right hand and to right wrist. Patient able to bend fingers but index finger is limited by pain and swelling.   Neurologic:  Normal speech and language.  Skin:  Skin is warm, dry and intact. Redness to the right hand with redness up the arm. Psychiatric: Mood and affect are normal.    ____________________________________________   LABS (all labs ordered are listed, but only abnormal results are displayed)  Labs Reviewed  CBC - Abnormal; Notable for the following:       Result Value   MCV 77.1 (*)    MCH 25.5 (*)    RDW 16.7 (*)    All other components within normal limits  BASIC METABOLIC PANEL - Abnormal; Notable for the following:    Glucose, Bld 199 (*)    All other components within normal limits  CULTURE, BLOOD (ROUTINE X 2)  CULTURE, BLOOD (ROUTINE X 2)   ____________________________________________  EKG  none ____________________________________________  RADIOLOGY  No results found.  ____________________________________________   PROCEDURES  Procedure(s) performed: None  Procedures  Critical Care performed: No  ____________________________________________   INITIAL IMPRESSION / ASSESSMENT AND PLAN / ED COURSE  As part of my medical decision making, I reviewed the following data within the electronic MEDICAL RECORD NUMBER Notes from prior ED visits   This is a 41 year old who comes into the hospital today with some right hand swelling, redness and pain after being  bitten by multiple ants.  My differential diagnosis includes allergic reaction, cellulitis, deep space infection  I will check some blood work including a CBC and a BMP. I feel that the patient has cellulitis given her symptoms. She has also been using Benadryl at home without any improvement. Looking at the bite marks on her hands I am concerned that the patient was bitten by fire ants. I will give the patient a dose of clindamycin as well as some Toradol for pain. She will be reassessed once I received her blood work.     The patient's blood work does not show any elevated white blood cell count. Her glucose is 199. The patient did receive her medication and her redness has not gotten any worse. It seems to have improved slightly. The patient did also receive a dose of Benadryl. She'll be discharged home to follow-up with her primary care  physician. ____________________________________________   FINAL CLINICAL IMPRESSION(S) / ED DIAGNOSES  Final diagnoses:  Cellulitis of right upper extremity  Insect bite, initial encounter  Insect bite of right hand with infection, initial encounter      NEW MEDICATIONS STARTED DURING THIS VISIT:  New Prescriptions   CLINDAMYCIN (CLEOCIN) 300 MG CAPSULE    Take 1 capsule (300 mg total) by mouth 3 (three) times daily.   TRAMADOL (ULTRAM) 50 MG TABLET    Take 1 tablet (50 mg total) by mouth every 6 (six) hours as needed.     Note:  This document was prepared using Dragon voice recognition software and may include unintentional dictation errors.    Rebecka Apley, MD 12/27/16 [phone]

## 2017-01-01 LAB — CULTURE, BLOOD (ROUTINE X 2)
CULTURE: NO GROWTH
Culture: NO GROWTH
SPECIAL REQUESTS: ADEQUATE
SPECIAL REQUESTS: ADEQUATE

## 2017-01-03 LAB — B. BURGDORFI ANTIBODIES, CSF

## 2017-04-12 ENCOUNTER — Other Ambulatory Visit: Payer: Self-pay | Admitting: Family Medicine

## 2017-04-12 ENCOUNTER — Ambulatory Visit
Admission: RE | Admit: 2017-04-12 | Discharge: 2017-04-12 | Disposition: A | Discharge status: Home / Self Care | Payer: Medicaid Other | Source: Ambulatory Visit | Attending: Family Medicine | Admitting: Family Medicine

## 2017-04-12 DIAGNOSIS — R7611 Nonspecific reaction to tuberculin skin test without active tuberculosis: Secondary | ICD-10-CM

## 2017-05-15 ENCOUNTER — Emergency Department
Admission: EM | Admit: 2017-05-15 | Discharge: 2017-05-15 | Disposition: A | Discharge status: Home / Self Care | Payer: Medicaid Other | Attending: Emergency Medicine | Admitting: Emergency Medicine

## 2017-05-15 ENCOUNTER — Other Ambulatory Visit: Payer: Self-pay

## 2017-05-15 DIAGNOSIS — Z794 Long term (current) use of insulin: Secondary | ICD-10-CM | POA: Diagnosis not present

## 2017-05-15 DIAGNOSIS — F17210 Nicotine dependence, cigarettes, uncomplicated: Secondary | ICD-10-CM | POA: Insufficient documentation

## 2017-05-15 DIAGNOSIS — Z79899 Other long term (current) drug therapy: Secondary | ICD-10-CM | POA: Insufficient documentation

## 2017-05-15 DIAGNOSIS — E119 Type 2 diabetes mellitus without complications: Secondary | ICD-10-CM | POA: Diagnosis not present

## 2017-05-15 DIAGNOSIS — Z760 Encounter for issue of repeat prescription: Secondary | ICD-10-CM | POA: Diagnosis not present

## 2017-05-15 DIAGNOSIS — F419 Anxiety disorder, unspecified: Secondary | ICD-10-CM | POA: Insufficient documentation

## 2017-05-15 HISTORY — DX: Multiple sclerosis, unspecified: G35.D

## 2017-05-15 HISTORY — DX: Multiple sclerosis: G35

## 2017-05-15 MED ORDER — CLONAZEPAM 0.5 MG PO TABS: 0.5000 mg | ORAL_TABLET | Freq: Two times a day (BID) | ORAL | 0 refills | Status: DC | PRN

## 2017-05-15 NOTE — ED Notes (Signed)
Pt presents today with anxiety and depression. Pt states she needs something more than the medication she has been on as it is not working. Awaiting EDP.
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
## 2017-06-24 ENCOUNTER — Encounter: Payer: Self-pay | Admitting: Dietician

## 2017-06-24 ENCOUNTER — Encounter: Payer: Medicaid Other | Attending: "Endocrinology | Admitting: Dietician

## 2017-06-24 VITALS — BP 107/76 | Ht 64.0 in | Wt 125.0 lb

## 2017-06-24 DIAGNOSIS — E1029 Type 1 diabetes mellitus with other diabetic kidney complication: Secondary | ICD-10-CM

## 2017-06-24 DIAGNOSIS — Z713 Dietary counseling and surveillance: Secondary | ICD-10-CM | POA: Diagnosis present

## 2017-06-24 DIAGNOSIS — E109 Type 1 diabetes mellitus without complications: Secondary | ICD-10-CM | POA: Diagnosis present

## 2017-06-24 DIAGNOSIS — R809 Proteinuria, unspecified: Secondary | ICD-10-CM

## 2017-06-24 NOTE — Patient Instructions (Addendum)
  Check blood sugars 4 x day before each meal and before bed every day + some 2 hr after meals  Bring blood sugar records to the next appointment  Exercise:  Continue walking 60 min daily but ONLY if BG's are <250-as permitted by MD  Eat 3 meals day,   2-3  snacks a day  Eat 2-3 carbohydrate servings/meal + protein (30-45 grams carbohydrates)  Eat 1 carbohydrate serving/snack + protein (15 grams carbohydrates)  Limit intake of sweets/desserts and fried foods  Make healthy food choices  Space meals 4-5 hours apart  Avoid sugar sweetened drinks (soda, tea, coffee, sports drinks, juices) and drink plenty of water  Complete 3 Day Food Record and bring to next appt-estimate carbohydrate grams  Quit / Decrease smoking-consider Quit Smart classes  Get a Games developer fast acting glucose and a snack at all times  Take Novolog 15-20 min. before meals when able  Try using Insulin Carb Ratio 1 unit/15 grams carbs for meal doses and correction 1 unit/50 if BG>150  Rotate injection sites  Carry medical alert ID at all times  If having low BG's often, call [phone]  Return for appointment/classes on: 07-08-17

## 2017-06-24 NOTE — Progress Notes (Signed)
Diabetes Self-Management Education  Visit Type: First/Initial  Appt. Start Time: 1030 Appt. End Time:1140  06/24/2017  [name], identified by name and date of birth, is a 42 y.o. female with a diagnosis of Diabetes: Type 1.   ASSESSMENT  Blood pressure 107/76, height 5\' 4"  (1.626 m), weight 125 lb (56.7 kg). Body mass index is 21.46 kg/m.  Diabetes Self-Management Education - 06/24/17 1425      Visit Information   Visit Type  First/Initial      Initial Visit   Diabetes Type  Type 1      Health Coping   How would you rate your overall health?  Poor      Psychosocial Assessment   Patient Belief/Attitude about Diabetes  Motivated to manage diabetes    Self-care barriers  None    Self-management support  Doctor's office    Other persons present  Patient    Patient Concerns  Glycemic Control;Other (comment) quit smoking, prevent complications    Special Needs  None    Preferred Learning Style  Auditory;Visual;Hands on    Learning Readiness  Ready    What is the last grade level you completed in school?  some college      Pre-Education Assessment   Patient understands the diabetes disease and treatment process.  Demonstrates understanding / competency    Patient understands incorporating nutritional management into lifestyle.  Needs Review    Patient undertands incorporating physical activity into lifestyle.  Needs Review    Patient understands using medications safely.  Needs Review    Patient understands monitoring blood glucose, interpreting and using results  Needs Review    Patient understands prevention, detection, and treatment of acute complications.  Needs Review    Patient understands prevention, detection, and treatment of chronic complications.  Needs Review    Patient understands how to develop strategies to address psychosocial issues.  Needs Review    Patient understands how to develop strategies to promote health/change behavior.  Needs Review      Complications   Last HgB A1C per patient/outside source  10.4 % 04-03-17    How often do you check your blood sugar?  > 4 times/day    Fasting Blood glucose range (mg/dL)  40-981;191-478;295-621    Postprandial Blood glucose range (mg/dL)  <30;86-578;469-629;528-413;>244    Number of hypoglycemic episodes per month  2    Have you had a dilated eye exam in the past 12 months?  Yes 03-2017    Have you had a dental exam in the past 12 months?  Yes 03-2017    Are you checking your feet?  Yes    How many days per week are you checking your feet?  7      Dietary Intake   Breakfast eats breakfast at 8-9a=usually eats egg, bacon or sausage + sometimes toast or waffle  (pt reports feeling hungry often)   Snack (morning)  eats peach cup or crab cake at 11a-12p    Lunch  eats lunch at 2-3p; eats snack foods and sweets 4-5x/wk    Snack (afternoon)  eats peach cup or crab cake at [employer]  eats supper 7-8p    Snack (evening)  eats peach cup or crab cake at 10p and 12a    Beverage(s)  drinks water 4-5x/day and sugar free drinks 2-3x/day and occasional milk      Exercise   Exercise Type  Light (walking / raking leaves)    How  many days per week to you exercise?  7    How many minutes per day do you exercise?  60    Total minutes per week of exercise  420      Patient Education   Previous Diabetes Education  Yes (please comment) 6 years ago at [facility]    Nutrition management   Role of diet in the treatment of diabetes and the relationship between the three main macronutrients and blood glucose level;Carbohydrate counting;Food label reading, portion sizes and measuring food.;Meal timing in regards to the patients' current diabetes medication.    Physical activity and exercise   Role of exercise on diabetes management, blood pressure control and cardiac health.    Medications  Taught/reviewed insulin injection, site rotation, insulin storage and needle disposal.;Reviewed patients medication  for diabetes, action, purpose, timing of dose and side effects.    Monitoring  Purpose and frequency of SMBG.;Identified appropriate SMBG and/or A1C goals.;Taught/discussed recording of test results and interpretation of SMBG.    Acute complications  Taught treatment of hypoglycemia - the 15 rule. gave pt 1 tube of glucose tablets for PRN use    Chronic complications  Relationship between chronic complications and blood glucose control;Nephropathy, what it is, prevention of, the use of ACE, ARB's and early detection of through urine microalbumia.;Retinopathy and reason for yearly dilated eye exams    Personal strategies to promote health  Lifestyle issues that need to be addressed for better diabetes care;Helped patient develop diabetes management plan for (enter comment)      Outcomes   Expected Outcomes  Demonstrated interest in learning. Expect positive outcomes       Individualized Plan for Diabetes Self-Management Training:   Learning Objective:  Patient will have a greater understanding of diabetes self-management. Patient education plan is to attend individual and/or group sessions per assessed needs and concerns.   Plan:   Patient Instructions   Check blood sugars 4 x day before each meal and before bed every day + some 2 hr after meals  Bring blood sugar records to the next appointment  Exercise:  Continue walking 60 min daily but ONLY if BG's are <250-as permitted by MD  Eat 3 meals day,   2-3  snacks a day  Eat 2-3 carbohydrate servings/meal + protein (30-45 grams carbohydrates)  Eat 1 carbohydrate serving/snack + protein (15 grams carbohydrates)  Limit intake of sweets/desserts and fried foods  Drink plenty of water  Make healthy food choices  Space meals 4-5 hours apart  Avoid sugar sweetened drinks (soda, tea, coffee, sports drinks, juices)  Complete 3 Day Food Record and bring to next appt-estimate carb grams  Quit / Decrease smoking-consider Quit Smart  classes  Get a Games developer fast acting glucose and a snack at all times  Rotate injection sites  Take Novolog 15-20 min. before meals when able  Try using ICR 1:15 for meal boluses and ISF 1:50 for BG>150  Carry medical alert ID at all times  If having low BG's often, call [phone]  Return for appointment on: 07-08-17   Expected Outcomes:  Demonstrated interest in learning. Expect positive outcomes  Education material provided: General meal planning guidelines, low BG handout, 1 tube glucose tablets for PRN use, medical alert ID card and coupon  If problems or questions, patient to contact team via:  [phone]  Future DSME appointment:  07-08-17

## 2017-06-25 ENCOUNTER — Encounter: Payer: Self-pay | Admitting: Dietician

## 2017-06-25 NOTE — Progress Notes (Signed)
Called pt-no answer-left message for pt to call with update

## 2017-07-01 ENCOUNTER — Encounter: Payer: Self-pay | Admitting: Dietician

## 2017-07-01 NOTE — Progress Notes (Signed)
Pt called on 06-26-17 with update on BG's-pt reports BG's are doing better-pt reports using ICR 1:15 for meals-pt eats peach cups often for snacks and does not take insulin for them-she has had some pp BG's and ac meals  70's-80's- expect Lantus may need to be reduced-will call pt 06-27-17 for update

## 2017-07-01 NOTE — Progress Notes (Signed)
Called pt on 06-27-17 and no answer-left message for pt to call me but she has not responded

## 2017-07-02 ENCOUNTER — Emergency Department
Admission: EM | Admit: 2017-07-02 | Discharge: 2017-07-02 | Disposition: A | Discharge status: Home / Self Care | Payer: Medicaid Other | Attending: Emergency Medicine | Admitting: Emergency Medicine

## 2017-07-02 ENCOUNTER — Other Ambulatory Visit: Payer: Self-pay

## 2017-07-02 ENCOUNTER — Encounter: Payer: Self-pay | Admitting: Emergency Medicine

## 2017-07-02 DIAGNOSIS — G35 Multiple sclerosis: Secondary | ICD-10-CM | POA: Insufficient documentation

## 2017-07-02 DIAGNOSIS — F17210 Nicotine dependence, cigarettes, uncomplicated: Secondary | ICD-10-CM | POA: Insufficient documentation

## 2017-07-02 DIAGNOSIS — E119 Type 2 diabetes mellitus without complications: Secondary | ICD-10-CM | POA: Insufficient documentation

## 2017-07-02 DIAGNOSIS — J029 Acute pharyngitis, unspecified: Secondary | ICD-10-CM | POA: Diagnosis present

## 2017-07-02 DIAGNOSIS — Z794 Long term (current) use of insulin: Secondary | ICD-10-CM | POA: Insufficient documentation

## 2017-07-02 DIAGNOSIS — Z79899 Other long term (current) drug therapy: Secondary | ICD-10-CM | POA: Insufficient documentation

## 2017-07-02 DIAGNOSIS — L040 Acute lymphadenitis of face, head and neck: Secondary | ICD-10-CM | POA: Insufficient documentation

## 2017-07-02 LAB — CBC WITH DIFFERENTIAL/PLATELET
BASOS PCT: 2 %
Basophils Absolute: 0.1 10*3/uL (ref 0–0.1)
EOS PCT: 1 %
Eosinophils Absolute: 0.1 10*3/uL (ref 0–0.7)
HCT: 36.4 % (ref 35.0–47.0)
Hemoglobin: 11.8 g/dL — ABNORMAL LOW (ref 12.0–16.0)
LYMPHS PCT: 28 %
Lymphs Abs: 2.3 10*3/uL (ref 1.0–3.6)
MCH: 24.6 pg — ABNORMAL LOW (ref 26.0–34.0)
MCHC: 32.4 g/dL (ref 32.0–36.0)
MCV: 76 fL — ABNORMAL LOW (ref 80.0–100.0)
MONO ABS: 1 10*3/uL — AB (ref 0.2–0.9)
Monocytes Relative: 13 %
NEUTROS ABS: 4.7 10*3/uL (ref 1.4–6.5)
Neutrophils Relative %: 56 %
PLATELETS: 310 10*3/uL (ref 150–440)
RBC: 4.79 MIL/uL (ref 3.80–5.20)
RDW: 17.7 % — AB (ref 11.5–14.5)
WBC: 8.3 10*3/uL (ref 3.6–11.0)

## 2017-07-02 LAB — BASIC METABOLIC PANEL
Anion gap: 8 (ref 5–15)
BUN: 14 mg/dL (ref 6–20)
CALCIUM: 9.4 mg/dL (ref 8.9–10.3)
CO2: 29 mmol/L (ref 22–32)
Chloride: 96 mmol/L — ABNORMAL LOW (ref 101–111)
Creatinine, Ser: 0.72 mg/dL (ref 0.44–1.00)
Glucose, Bld: 49 mg/dL — ABNORMAL LOW (ref 65–99)
POTASSIUM: 3.6 mmol/L (ref 3.5–5.1)
SODIUM: 133 mmol/L — AB (ref 135–145)

## 2017-07-02 LAB — GLUCOSE, CAPILLARY: GLUCOSE-CAPILLARY: 162 mg/dL — AB (ref 65–99)

## 2017-07-02 MED ORDER — CEPHALEXIN 500 MG PO CAPS: 500.0000 mg | ORAL_CAPSULE | Freq: Four times a day (QID) | ORAL | 0 refills | Status: AC

## 2017-07-02 NOTE — ED Notes (Signed)
Pt c/o of sore throat with internal lump on right side of throat x1day. Denies cough, sputum, fever or SOB. NAD. VSS.

## 2017-07-02 NOTE — ED Triage Notes (Signed)
Pt presents to ED via POV with c/o lump in her throat that is getting bigger. Pt states she feels like L side of her throat is closing. Pt to triage with drink, noted to be maintaining her own secretions. Pt states pain is under her R jaw with swelling, pt states pain with swallowing, and turning her head.

## 2017-07-02 NOTE — ED Provider Notes (Signed)
[facility] Emergency Department Provider Note   ____________________________________________   First MD Initiated Contact with Patient 07/02/17 1846     (approximate)  I have reviewed the triage vital signs and the nursing notes.   HISTORY  Chief Complaint Sore Throat    HPI Anna Singh is a 42 y.o. female who reports swollen tender area in the right side of her throat its seeming to spread as the day goes on and is now little further up toward the angle of the jaw and little further down toward the larynx on the right side of the neck.  Hurts to move her neck and to swallow.  The pain is bad but not severe.   Past Medical History:  Diagnosis Date  . Diabetes mellitus without complication (HCC)   . Hyperlipidemia   . MS (multiple sclerosis) (HCC)   . Vitamin D deficiency     Patient Active Problem List   Diagnosis Date Noted  . MS (multiple sclerosis) (HCC) 12/14/2016    Past Surgical History:  Procedure Laterality Date  . APPENDECTOMY      Prior to Admission medications   Medication Sig Start Date End Date Taking? Authorizing Provider  BIOTIN PO Take 1 tablet by mouth daily.    [provider]  cephALEXin (KEFLEX) 500 MG capsule Take 1 capsule (500 mg total) by mouth 4 (four) times daily for 10 days. 07/02/17 07/12/17  Arnaldo Natal, MD  clonazePAM (KLONOPIN) 0.5 MG tablet Take 1 tablet (0.5 mg total) by mouth 2 (two) times daily as needed for anxiety. 05/15/17 05/15/18  Sharman Cheek, MD  cyclobenzaprine (FLEXERIL) 5 MG tablet Take 1 tablet by mouth as needed. 05/01/17   [provider]  Ergocalciferol (VITAMIN D2 PO) Take 2,000 tablets by mouth daily.    [provider]  insulin aspart (NOVOLOG FLEXPEN) 100 UNIT/ML FlexPen Inject 2-6 Units into the skin 3 (three) times daily before meals. Pt  takes 2 units for correction of BG's >200    [provider]  insulin glargine (LANTUS) 100 unit/mL SOPN  Inject 20 Units into the skin daily. Takes every AM    [provider]  losartan (COZAAR) 25 MG tablet Take 1 tablet by mouth daily. 04/12/17   [provider]  oxyCODONE (OXY IR/ROXICODONE) 5 MG immediate release tablet Take 1 tablet (5 mg total) by mouth every 6 (six) hours as needed for moderate pain or breakthrough pain. Patient not taking: Reported on 06/24/2017 12/17/16   Alford Highland, MD  promethazine (PHENERGAN) 12.5 MG tablet Take 1 tablet (12.5 mg total) by mouth every 6 (six) hours as needed for nausea or vomiting. Patient not taking: Reported on 06/24/2017 12/17/16   Alford Highland, MD  traMADol (ULTRAM) 50 MG tablet Take 1 tablet (50 mg total) by mouth every 6 (six) hours as needed. Patient not taking: Reported on 06/24/2017 12/27/16   Rebecka Apley, MD    Allergies Other and Iodine  History reviewed. No pertinent family history.  Social History Social History   Tobacco Use  . Smoking status: Light Tobacco Smoker    Packs/day: 0.20    Types: Cigarettes  . Smokeless tobacco: Never Used  . Tobacco comment: 2 per day  Substance Use Topics  . Alcohol use: Yes    Alcohol/week: 0.0 - 0.6 oz  . Drug use: No    Review of Systems  Constitutional: No fever/chills Eyes: No visual changes. ENT: See HPI Cardiovascular: Denies chest pain. Respiratory:  Denies shortness of breath. Gastrointestinal: No abdominal pain.  No nausea, no vomiting.  No diarrhea.  No constipation. Genitourinary: Negative for dysuria. Musculoskeletal: Negative for back pain. Skin: Negative for rash. Neurological: Negative for headaches, focal weakness  ____________________________________________   PHYSICAL EXAM:  VITAL SIGNS: ED Triage Vitals  Enc Vitals Group     BP 07/02/17 1742 (!) 138/97     Pulse Rate 07/02/17 1742 (!) 102     Resp 07/02/17 1742 18     Temp 07/02/17 1742 98.6 F (37 C)     Temp Source 07/02/17 1742 Oral     SpO2 07/02/17 1742 99 %     Weight  07/02/17 1743 125 lb (56.7 kg)     Height 07/02/17 1743 5\' 4"  (1.626 m)     Head Circumference --      Peak Flow --      Pain Score 07/02/17 1742 8     Pain Loc --      Pain Edu? --      Excl. in GC? --     Constitutional: Alert and oriented. Well appearing and in no acute distress. Eyes: Conjunctivae are normal. Head: Atraumatic. Nose: No congestion/rhinnorhea. Mouth/Throat: Mucous membranes are moist.  Oropharynx non-erythematous. Neck: No stridor.  Hematological/Lymphatic/Immunilogical: Right-sided cervical lymphadenopathy patient has approximately 3 slightly less than 1 cm tender swollen "glands on the right side of the neck palpation of these exactly reproduces her pain.. Cardiovascular: Normal rate, regular rhythm. Grossly normal heart sounds.  Good peripheral circulation. Respiratory: Normal respiratory effort.  No retractions. Lungs CTAB. Gastrointestinal: Soft and nontender. No distention. No abdominal bruits. No CVA tenderness. Musculoskeletal: No lower extremity tenderness nor edema.  No joint effusions. Neurologic:  Normal speech and language. No gross focal neurologic deficits are appreciated. Skin:  Skin is warm, dry and intact. No rash noted. Psychiatric: Mood and affect are normal. Speech and behavior are normal.  ____________________________________________   LABS (all labs ordered are listed, but only abnormal results are displayed)  Labs Reviewed  CBC WITH DIFFERENTIAL/PLATELET - Abnormal; Notable for the following components:      Result Value   Hemoglobin 11.8 (*)    MCV 76.0 (*)    MCH 24.6 (*)    RDW 17.7 (*)    Monocytes Absolute 1.0 (*)    All other components within normal limits  BASIC METABOLIC PANEL - Abnormal; Notable for the following components:   Sodium 133 (*)    Chloride 96 (*)    Glucose, Bld 49 (*)    All other components within normal limits  CBG MONITORING, ED    ____________________________________________  EKG   ____________________________________________  RADIOLOGY  ED MD interpretation:    Official radiology report(s): No results found.  ____________________________________________   PROCEDURES  Procedure(s) performed:   Procedures  Critical Care performed:  ____________________________________________   INITIAL IMPRESSION / ASSESSMENT AND PLAN / ED COURSE  She is maintaining her secretions well her voice is normal she looks healthy.  Her sugar was low in the waiting room we will get a CBG and make sure that is okay  ----------------------------------------- 7:01 PM on 07/02/2017 -----------------------------------------  CBG is now 162 we will discharge her     ____________________________________________   FINAL CLINICAL IMPRESSION(S) / ED DIAGNOSES  Final diagnoses:  Acute cervical adenitis     ED Discharge Orders        Ordered    cephALEXin (KEFLEX) 500 MG capsule  4 times daily     07/02/17  1854       Note:  This document was prepared using Dragon voice recognition software and may include unintentional dictation errors.    Arnaldo Natal, MD 07/02/17 1901

## 2017-07-02 NOTE — Discharge Instructions (Signed)
It looks like you have an infection in some of the lymph glands in your neck.  I will give you some Keflex 1 pill 4 times a day.  That should take care of it within a few days.  Use Advil or Tylenol as needed for the pain.  Please return if your worse or not any better in 2 or 3 days.

## 2017-07-08 ENCOUNTER — Encounter: Payer: Self-pay | Admitting: Dietician

## 2017-07-08 ENCOUNTER — Encounter: Payer: Medicaid Other | Admitting: Dietician

## 2017-07-08 VITALS — BP 116/72 | Ht 64.0 in | Wt 126.0 lb

## 2017-07-08 DIAGNOSIS — E1029 Type 1 diabetes mellitus with other diabetic kidney complication: Secondary | ICD-10-CM

## 2017-07-08 DIAGNOSIS — R809 Proteinuria, unspecified: Principal | ICD-10-CM

## 2017-07-08 DIAGNOSIS — Z713 Dietary counseling and surveillance: Secondary | ICD-10-CM | POA: Diagnosis not present

## 2017-07-08 NOTE — Progress Notes (Signed)
Diabetes Self-Management Education  Visit Type:  Comprehensive  Appt. Start Time: 1005   Appt. End Time: 1100  07/08/2017  [name], identified by name and date of birth, is a 42 y.o. female with a diagnosis of Diabetes:  .   ASSESSMENT  Blood pressure 116/72, height 5\' 4"  (1.626 m), weight 126 lb (57.2 kg), last menstrual period 06/13/2017. Body mass index is 21.63 kg/m.   Diabetes Self-Management Education - 07/08/17 1017      Complications   How often do you check your blood sugar?  > 4 times/day    Fasting Blood glucose range (mg/dL)  28-768;115-726;203-559 mostly 70s-80s per patient; stopped Lantus for several days when ill and not eating    Postprandial Blood glucose range (mg/dL)  74-163;845-364;680-321 highest reading 216 recently, after 6pm    Number of hypoglycemic episodes per month  6    Can you tell when your blood sugar is low?  Yes    What do you do if your blood sugar is low?  eat fruit cups parfait (with cream), followed by protein bar with yogurt    Have you had a dilated eye exam in the past 12 months?  Yes    Have you had a dental exam in the past 12 months?  Yes    Are you checking your feet?  Yes    How many days per week are you checking your feet?  7      Dietary Intake   Breakfast  3 meals and 4 snacks daily      Exercise   Exercise Type  Light (walking / raking leaves)    How many days per week to you exercise?  7    How many minutes per day do you exercise?  60    Total minutes per week of exercise  420      Patient Education   Nutrition management   Food label reading, portion sizes and measuring food.;Carbohydrate counting;Meal options for control of blood glucose level and chronic complications.;Other (comment) fiber in BG control; daily carb needs + kcals for wt gain    Monitoring  Taught/discussed recording of test results and interpretation of SMBG.    Acute complications  Taught treatment of hypoglycemia - the 15 rule.        Learning Objective:  Patient will have a greater understanding of diabetes self-management. Patient education plan is to attend individual and/or group sessions per assessed needs and concerns.  Patient is making healthy food choices, and is counting carbs to estimate Novolog doses, using 15:1 Carb:insulin ratio.  She would like to gain some weight-- advised aiming for 1800-1900kcal daily with at least 40% CHO or 190-230grams daily.  She reports improved BGs although some readings still in 200s, and some low BGs in 50s.    Plan:   Patient Instructions   Continue making healthy food choices and eating regular meals and snacks.   For some gradual weight gain, aim for 1800-1900kcal daily, with 190-230 grams of carbohydrate or averaging 45grams with each meal.     Expected Outcomes:  Demonstrated interest in learning. Expect positive outcomes  Education material provided: Carb Counting and Meal Planning [employer]); Carb Counting (Aprida); Quick and Healthy Meals; Smart Snacking  If problems or questions, patient to contact team via:  Phone and Email  Future DSME appointment: -  07/22/17

## 2017-07-08 NOTE — Patient Instructions (Signed)
   Continue making healthy food choices and eating regular meals and snacks.   For some gradual weight gain, aim for 1800-1900kcal daily, with 190-230 grams of carbohydrate or averaging 45grams with each meal.

## 2017-07-18 ENCOUNTER — Other Ambulatory Visit: Payer: Self-pay

## 2017-07-18 ENCOUNTER — Encounter: Payer: Self-pay | Admitting: Emergency Medicine

## 2017-07-18 ENCOUNTER — Emergency Department
Admission: EM | Admit: 2017-07-18 | Discharge: 2017-07-18 | Disposition: A | Discharge status: Home / Self Care | Payer: Medicaid Other | Attending: Emergency Medicine | Admitting: Emergency Medicine

## 2017-07-18 DIAGNOSIS — W57XXXA Bitten or stung by nonvenomous insect and other nonvenomous arthropods, initial encounter: Secondary | ICD-10-CM | POA: Diagnosis not present

## 2017-07-18 DIAGNOSIS — T7840XA Allergy, unspecified, initial encounter: Secondary | ICD-10-CM | POA: Insufficient documentation

## 2017-07-18 DIAGNOSIS — F17210 Nicotine dependence, cigarettes, uncomplicated: Secondary | ICD-10-CM | POA: Diagnosis not present

## 2017-07-18 DIAGNOSIS — Z794 Long term (current) use of insulin: Secondary | ICD-10-CM | POA: Insufficient documentation

## 2017-07-18 DIAGNOSIS — Z79899 Other long term (current) drug therapy: Secondary | ICD-10-CM | POA: Diagnosis not present

## 2017-07-18 DIAGNOSIS — E119 Type 2 diabetes mellitus without complications: Secondary | ICD-10-CM | POA: Diagnosis not present

## 2017-07-18 DIAGNOSIS — R21 Rash and other nonspecific skin eruption: Secondary | ICD-10-CM | POA: Diagnosis present

## 2017-07-18 MED ORDER — PREDNISONE 20 MG PO TABS
60.0000 mg | ORAL_TABLET | Freq: Once | ORAL | Status: AC
  Administered 2017-07-18: 60 mg via ORAL
  Filled 2017-07-18: qty 3

## 2017-07-18 MED ORDER — PREDNISONE 50 MG PO TABS: 50.0000 mg | ORAL_TABLET | Freq: Every day | ORAL | 0 refills | Status: AC

## 2017-07-18 MED ORDER — DIPHENHYDRAMINE HCL 25 MG PO CAPS
50.0000 mg | ORAL_CAPSULE | Freq: Once | ORAL | Status: AC
  Administered 2017-07-18: 50 mg via ORAL
  Filled 2017-07-18: qty 2

## 2017-07-18 MED ORDER — FAMOTIDINE 20 MG PO TABS
20.0000 mg | ORAL_TABLET | Freq: Once | ORAL | Status: AC
  Administered 2017-07-18: 20 mg via ORAL
  Filled 2017-07-18: qty 1

## 2017-07-18 MED ORDER — DIPHENHYDRAMINE HCL 50 MG PO TABS: 50.0000 mg | ORAL_TABLET | Freq: Two times a day (BID) | ORAL | 0 refills | Status: DC

## 2017-07-18 MED ORDER — FAMOTIDINE 20 MG PO TABS: 20.0000 mg | ORAL_TABLET | Freq: Two times a day (BID) | ORAL | 0 refills | Status: DC

## 2017-07-18 NOTE — Discharge Instructions (Signed)
It was a pleasure to take care of you today, and thank you for coming to our emergency department.  If you have any questions or concerns before leaving please ask the nurse to grab me and I'm more than happy to go through your aftercare instructions again.  If you were prescribed any opioid pain medication today such as Norco, Vicodin, Percocet, morphine, hydrocodone, or oxycodone please make sure you do not drive when you are taking this medication as it can alter your ability to drive safely.  If you have any concerns once you are home that you are not improving or are in fact getting worse before you can make it to your follow-up appointment, please do not hesitate to call 911 and come back for further evaluation.

## 2017-07-18 NOTE — ED Notes (Signed)

## 2017-07-18 NOTE — ED Triage Notes (Signed)
Patient ambulatory to triage with steady gait, without difficulty or distress noted; pt reports rash noted since yesterday with itching

## 2017-07-18 NOTE — ED Provider Notes (Signed)
[facility] Emergency Department Provider Note  ____________________________________________   None    (approximate)  I have reviewed the triage vital signs and the nursing notes.   HISTORY  Chief Complaint Rash   HPI Anna Singh is a 42 y.o. female itching rash across her upper chest and bilateral lower extremities for the past several hours.  No shortness of breath.  No throat swelling.  No new exposures.  Symptoms are mild severity.  They came on suddenly and has been constant.  Past Medical History:  Diagnosis Date  . Diabetes mellitus without complication (HCC)   . Hyperlipidemia   . MS (multiple sclerosis) (HCC)   . Vitamin D deficiency     Patient Active Problem List   Diagnosis Date Noted  . MS (multiple sclerosis) (HCC) 12/14/2016    Past Surgical History:  Procedure Laterality Date  . APPENDECTOMY      Prior to Admission medications   Medication Sig Start Date End Date Taking? Authorizing Provider  BIOTIN PO Take 1 tablet by mouth daily.    [provider]  clonazePAM (KLONOPIN) 0.5 MG tablet Take 1 tablet (0.5 mg total) by mouth 2 (two) times daily as needed for anxiety. 05/15/17 05/15/18  Sharman Cheek, MD  cyclobenzaprine (FLEXERIL) 5 MG tablet Take 1 tablet by mouth as needed. 05/01/17   [provider]  diphenhydrAMINE (BENADRYL) 50 MG tablet Take 1 tablet (50 mg total) by mouth 2 (two) times daily for 5 days. 07/18/17 07/23/17  Merrily Brittle, MD  Ergocalciferol (VITAMIN D2 PO) Take 2,000 tablets by mouth daily.    [provider]  famotidine (PEPCID) 20 MG tablet Take 1 tablet (20 mg total) by mouth 2 (two) times daily for 5 days. 07/18/17 07/23/17  Merrily Brittle, MD  insulin aspart (NOVOLOG FLEXPEN) 100 UNIT/ML FlexPen Inject 2-6 Units into the skin 3 (three) times daily before meals. Pt  takes 2 units for correction of BG's >200    [provider]  insulin glargine (LANTUS) 100 unit/mL SOPN  Inject 20 Units into the skin daily. Takes every AM    [provider]  losartan (COZAAR) 25 MG tablet Take 1 tablet by mouth daily. 04/12/17   [provider]  oxyCODONE (OXY IR/ROXICODONE) 5 MG immediate release tablet Take 1 tablet (5 mg total) by mouth every 6 (six) hours as needed for moderate pain or breakthrough pain. 12/17/16   Alford Highland, MD  predniSONE (DELTASONE) 50 MG tablet Take 1 tablet (50 mg total) by mouth daily for 4 days. 07/18/17 07/22/17  Merrily Brittle, MD  promethazine (PHENERGAN) 12.5 MG tablet Take 1 tablet (12.5 mg total) by mouth every 6 (six) hours as needed for nausea or vomiting. 12/17/16   Alford Highland, MD  traMADol (ULTRAM) 50 MG tablet Take 1 tablet (50 mg total) by mouth every 6 (six) hours as needed. 12/27/16   Rebecka Apley, MD    Allergies Other  No family history on file.  Social History Social History   Tobacco Use  . Smoking status: Light Tobacco Smoker    Packs/day: 0.05    Types: Cigarettes  . Smokeless tobacco: Never Used  . Tobacco comment: 0-2 per day  Substance Use Topics  . Alcohol use: Yes    Alcohol/week: 0.0 - 0.6 oz  . Drug use: No    Review of Systems Constitutional: No fever/chills ENT: No sore throat. Cardiovascular: Denies chest pain. Respiratory: Denies shortness of breath. Gastrointestinal: No abdominal pain.  No nausea, no vomiting.  No diarrhea.  No constipation. Musculoskeletal: Negative for back pain. Neurological: Negative for headaches   ____________________________________________   PHYSICAL EXAM:  VITAL SIGNS: ED Triage Vitals  Enc Vitals Group     BP 07/18/17 0505 (!) 152/96     Pulse Rate 07/18/17 0505 99     Resp 07/18/17 0505 20     Temp 07/18/17 0505 97.8 F (36.6 C)     Temp Source 07/18/17 0505 Oral     SpO2 07/18/17 0505 100 %     Weight 07/18/17 0504 130 lb (59 kg)     Height 07/18/17 0504 5\' 4"  (1.626 m)     Head Circumference --      Peak Flow --      Pain  Score 07/18/17 0504 0     Pain Loc --      Pain Edu? --      Excl. in GC? --     Constitutional: Alert and oriented x4 itching her left upper chest nontoxic no diaphoresis speaks full clear sentences Head: Atraumatic. Nose: No congestion/rhinnorhea. Mouth/Throat: No trismus Neck: No stridor.   Respiratory: Normal respiratory effort.  No retractions. Neurologic:  Normal speech and language. No gross focal neurologic deficits are appreciated.  Skin: Appears to have multiple mild bug bites across left upper chest and bilateral lower extremities    ____________________________________________  LABS (all labs ordered are listed, but only abnormal results are displayed)  Labs Reviewed - No data to display   __________________________________________  EKG   ____________________________________________  RADIOLOGY   ____________________________________________   DIFFERENTIAL includes but not limited to  Allergic reaction, bug bite, and flexed   PROCEDURES  Procedure(s) performed: no  Procedures  Critical Care performed: no  Observation: no ____________________________________________   INITIAL IMPRESSION / ASSESSMENT AND PLAN / ED COURSE  Pertinent labs & imaging results that were available during my care of the patient were reviewed by me and considered in my medical decision making (see chart for details).  Patient is very well-appearing with what appears to be multiple bug bites.  No signs of anaphylaxis.  We will treat her symptomatically and refer her back to primary care.      ____________________________________________   FINAL CLINICAL IMPRESSION(S) / ED DIAGNOSES  Final diagnoses:  Allergic reaction, initial encounter  Insect bite, unspecified site, initial encounter      NEW MEDICATIONS STARTED DURING THIS VISIT:  Discharge Medication List as of 07/18/2017  5:18 AM    START taking these medications   Details  diphenhydrAMINE (BENADRYL)  50 MG tablet Take 1 tablet (50 mg total) by mouth 2 (two) times daily for 5 days., Starting Thu 07/18/2017, Until Tue 07/23/2017, Print    famotidine (PEPCID) 20 MG tablet Take 1 tablet (20 mg total) by mouth 2 (two) times daily for 5 days., Starting Thu 07/18/2017, Until Tue 07/23/2017, Print    predniSONE (DELTASONE) 50 MG tablet Take 1 tablet (50 mg total) by mouth daily for 4 days., Starting Thu 07/18/2017, Until Mon 07/22/2017, Print         Note:  This document was prepared using Dragon voice recognition software and may include unintentional dictation errors.      Merrily Brittle, MD 07/19/17 0730

## 2017-07-22 ENCOUNTER — Encounter: Payer: Self-pay | Admitting: Dietician

## 2017-07-22 ENCOUNTER — Encounter: Payer: Medicaid Other | Attending: "Endocrinology | Admitting: Dietician

## 2017-07-22 VITALS — Wt 131.8 lb

## 2017-07-22 DIAGNOSIS — R809 Proteinuria, unspecified: Secondary | ICD-10-CM

## 2017-07-22 DIAGNOSIS — E1029 Type 1 diabetes mellitus with other diabetic kidney complication: Secondary | ICD-10-CM

## 2017-07-22 DIAGNOSIS — Z713 Dietary counseling and surveillance: Secondary | ICD-10-CM | POA: Insufficient documentation

## 2017-07-22 DIAGNOSIS — E109 Type 1 diabetes mellitus without complications: Secondary | ICD-10-CM | POA: Diagnosis present

## 2017-07-22 NOTE — Progress Notes (Addendum)
Diabetes Self-Management Education  Visit Type:  Follow-up  Appt. Start Time: 1030 Appt. End Time: 1145  07/22/2017  [name], identified by name and date of birth, is a 42 y.o. female with a diagnosis of Diabetes:  .   ASSESSMENT  Weight 131 lb 12.8 oz (59.8 kg). Body mass index is 22.62 kg/m.   Diabetes Self-Management Education - 07/22/17 1300      Complications   How often do you check your blood sugar?  > 4 times/day    Fasting Blood glucose range (mg/dL)  78-295;621-308;657-846;>962 on prednisone x5 days for rash and BG's 300's-400's(took last prednisone this am);FBG 379 today    Postprandial Blood glucose range (mg/dL)  <95; 284-132;440-102;>725    Have you had a dilated eye exam in the past 12 months?  Yes    Have you had a dental exam in the past 12 months?  Yes    Are you checking your feet?  Yes    How many days per week are you checking your feet?  7      Dietary Intake   Breakfast  eats 3 meals/day and 3 snacks eats breakfast 11a-12p; lunch at 4p and supper at 8p    Snack (morning)  -- eats peach parfait cups and protein bars for snacks; does not take Novolog for snacks during afternoon and BG's usually are 80's-90's ac supper     Exercise   Exercise Type  Light (walking / raking leaves)    How many days per week to you exercise?  7    How many minutes per day do you exercise?  90    Total minutes per week of exercise  630      Patient Education   Nutrition management   Carbohydrate counting    Physical activity and exercise   Role of exercise on diabetes management, blood pressure control and cardiac health.;Identified with patient nutritional and/or medication changes necessary with exercise.    Medications----uses ICR 1:15 for meal boluses + correction doses using 1 unit/50 if BG<150; pt takes novolog 15 min before meals  Taught/reviewed insulin injection, site rotation, insulin storage and needle disposal.;Reviewed patients medication for diabetes,  action, purpose, timing of dose and side effects. on 5 days prednisone(took last dose today)-BG's have been 300-400's with prednisone   Monitoring  Purpose and frequency of SMBG.;Identified appropriate SMBG and/or A1C goals.;Urine Ketone testing, when, how.;Daily foot exams;Yearly dilated eye exam; prior to taking prednisone FBG's were mostly 90's-150's, ac lunch 80's-90's and ac supper and bedtime 150's-occasional 200's with occasional 50's; BG's high recently with prednisone for rash    Acute complications  Taught treatment of hypoglycemia - the 15 rule.;Discussed and identified patients' treatment of hyperglycemia.;Covered sick day management with medication and food.    Chronic complications  Relationship between chronic complications and blood glucose control;Assessed and discussed foot care and prevention of foot problems;Dental care;Retinopathy and reason for yearly dilated eye exams;Nephropathy, what it is, prevention of, the use of ACE, ARB's and early detection of through urine microalbumia.;Reviewed with patient heart disease, higher risk of, and prevention    Psychosocial adjustment  Role of stress on diabetes;Brainstormed with patient on coping mechanisms for social situations, getting support from significant others, dealing with feelings about diabetes discussed symptoms of depression and coping mechanisms    Personal strategies to promote health  Review risk of smoking and offered smoking cessation smokes 1-2 cigarettes 2 days/wk-still working on quitting       Learning Objective:  Patient will have a greater understanding of diabetes self-management. Patient education plan is to attend individual and/or group sessions per assessed needs and concerns.   Plan:   Patient Instructions   Check blood sugars 4 x day before each meal and before bed every day  Exercise:  Continue walking  30-60 min. daily  Space meals 4-5 hours apart  Aim for 45 grams carbs/meals and 15-20 grams  carbs/snacks + protein  Drink plenty of water  Quit smoking  Carry candy or glucose tablets at all times  Check feet daily  Continue to take Novolog meal dose 15-20 min before meals and rotate injection sites  Return for appointment  on: 08-19-17 with RN (pt does not want to FU with RD-feels comfortable with carbohydrate counting)    Expected Outcomes:   positive  Education material provided: Living Well With Diabetes booklet, A1C handout, Depression and Diabetes handout, foot care handout, Kidney test handout, DKA booklet  If problems or questions, patient to contact team via:  [phone]  Future DSME appointment: -  08-19-17

## 2017-07-22 NOTE — Patient Instructions (Addendum)
  Check blood sugars 4 x day before each meal and before bed every day  Exercise:  Continue walking  30-60 min. daily  Space meals 4-5 hours apart  Aim for 45 grams carbs/meals and 15-20 grams carbs/snacks + protein  Drink plenty of water  Quit smoking  Carry candy or glucose tablets at all times  Check feet daily  Continue to take Novolog meal dose 15-20 min before meals  Continue to rotate injection sites  Return for appointment  on: 08-19-17

## 2017-08-13 ENCOUNTER — Other Ambulatory Visit: Payer: Self-pay

## 2017-08-13 ENCOUNTER — Encounter: Payer: Self-pay | Admitting: Emergency Medicine

## 2017-08-13 ENCOUNTER — Emergency Department
Admission: EM | Admit: 2017-08-13 | Discharge: 2017-08-13 | Disposition: A | Discharge status: Home / Self Care | Payer: Medicaid Other | Attending: Emergency Medicine | Admitting: Emergency Medicine

## 2017-08-13 DIAGNOSIS — Z5321 Procedure and treatment not carried out due to patient leaving prior to being seen by health care provider: Secondary | ICD-10-CM | POA: Diagnosis not present

## 2017-08-13 DIAGNOSIS — R51 Headache: Secondary | ICD-10-CM | POA: Insufficient documentation

## 2017-08-13 NOTE — ED Triage Notes (Signed)
Pt presents to ED with right sided headache intermittently all day. Hx of the same. Pt states she also noticed a knot to her right upper eye lid when she was looking in the mirror this evening.

## 2017-08-19 ENCOUNTER — Encounter: Payer: Self-pay | Admitting: Dietician

## 2017-08-19 ENCOUNTER — Encounter: Payer: Medicaid Other | Attending: "Endocrinology | Admitting: Dietician

## 2017-08-19 VITALS — Wt 128.7 lb

## 2017-08-19 DIAGNOSIS — Z713 Dietary counseling and surveillance: Secondary | ICD-10-CM | POA: Insufficient documentation

## 2017-08-19 DIAGNOSIS — E109 Type 1 diabetes mellitus without complications: Secondary | ICD-10-CM | POA: Diagnosis present

## 2017-08-19 DIAGNOSIS — E1029 Type 1 diabetes mellitus with other diabetic kidney complication: Secondary | ICD-10-CM

## 2017-08-19 DIAGNOSIS — R809 Proteinuria, unspecified: Secondary | ICD-10-CM

## 2017-08-19 NOTE — Patient Instructions (Signed)
Checks BG's ac meals and at bedtime and some 2 hr pp meals Rotate sites for insulin injections Carry glucose tablets or candy at all times Check urine ketones if BG's stay over 300 and drink plenty of water Take correction Novolog if bedtime BG over 200 using 1 unit/50  Take Novolog for bedtime snack unless BG <100 Take Novolog for snacks using ICR 1:15 Adjust Novolog dose if taking correction dose <3 hr since eating a meal (subtract 1 unit from full dose) Next appointment:  09-16-17

## 2017-08-19 NOTE — Progress Notes (Signed)
Diabetes Self-Management Education  Visit Type: Follow-up  Appt. Start Time: 1040 Appt. End Time: 1150  08/19/2017  [name], identified by name and date of birth, is a 42 y.o. female with a diagnosis of Diabetes:  .   ASSESSMENT  Weight 128 lb 11.2 oz (58.4 kg). Body mass index is 22.09 kg/m.  Diabetes Self-Management Education - 08/19/17 1300      Visit Information   Visit Type  Follow-up      Complications   How often do you check your blood sugar?  > 4 times/day    Fasting Blood glucose range (mg/dL)  98-119;147-829;562-130;>865 today's FBG 123 per pt    Postprandial Blood glucose range (mg/dL)  784-696;295-284;>132 BG's ac&pp lunch mostly good;ac supper 180's-200's;varies HS    Number of hypoglycemic episodes per month  2 60's-70's    Can you tell when your blood sugar is low?  Yes    Have you had a dilated eye exam in the past 12 months?  Yes    Have you had a dental exam in the past 12 months?  Yes    Are you checking your feet?  Yes    How many days per week are you checking your feet?  7      Dietary Intake   Breakfast  eats 3 meals/day and 4 snacks carb counts using  ICR 1:15 & ISF 50 for BG's >150      Exercise   Exercise Type  Light (walking / raking leaves)    How many days per week to you exercise?  7    How many minutes per day do you exercise?  60    Total minutes per week of exercise  420      Patient Education   Nutrition management   -- pt feels comfortable carb counting     Physical activity and exercise   Role of exercise on diabetes management, blood pressure control and cardiac health.    Medications  Taught/reviewed insulin injection, site rotation, insulin storage and needle disposal.;Reviewed medication adjustment guidelines for hyperglycemia and sick days. doesn't take novolog for bedtime snack or correction if need    Monitoring  Purpose and frequency of SMBG.;Interpreting lab values - A1C, lipid, urine microalbumina.;Identified  appropriate SMBG and/or A1C goals.;Ketone testing, when, how.;Daily foot exams;Yearly dilated eye exam    Acute complications  Taught treatment of hypoglycemia - the 15 rule.;Discussed and identified patients' treatment of hyperglycemia.;Covered sick day management with medication and food.    Chronic complications  Relationship between chronic complications and blood glucose control;Assessed and discussed foot care and prevention of foot problems;Lipid levels, blood glucose control and heart disease;Identified and discussed with patient  current chronic complications;Dental care;Retinopathy and reason for yearly dilated eye exams;Nephropathy, what it is, prevention of, the use of ACE, ARB's and early detection of through urine microalbumia.;Reviewed with patient heart disease, higher risk of, and prevention;Applicable immunizations    Psychosocial adjustment  Role of stress on diabetes    Preconception care  Pregnancy and GDM  Role of pre-pregnancy blood glucose control on the development of the fetus;Reviewed with patient blood glucose goals with pregnancy;Role of family planning for patients with diabetes    Personal strategies to promote health  Helped patient develop diabetes management plan for (enter comment)       Individualized Plan for Diabetes Self-Management Training:   Learning Objective:  Patient will have a greater understanding of diabetes self-management. Patient education plan is to attend individual  and/or group sessions per assessed needs and concerns.   Plan:  Checks BG's ac meals and at bedtime and some 2 hr pp meals Rotate sites for insulin injections Carry glucose tablets or candy at all times Check urine ketones if BG's stay over 300 and drink plenty of water Take correction Novolog if bedtime BG over 200 using 1 unit/50  Take Novolog for bedtime snack unless BG <100 Take Novolog for snacks using ICR 1:15 Adjust Novolog dose if taking correction dose <3 hr since eating  a meal (subtract 1 unit from full dose) Next appointment:  09-16-17  Expected Outcomes:   positive  Education material provided: Class 2 packet  If problems or questions, patient to contact team via: [phone]  Future DSME appointment:  09-16-17

## 2017-09-16 ENCOUNTER — Encounter: Payer: Medicaid Other | Attending: "Endocrinology | Admitting: Dietician

## 2017-09-16 ENCOUNTER — Encounter: Payer: Self-pay | Admitting: Dietician

## 2017-09-16 VITALS — BP 114/82 | Wt 128.9 lb

## 2017-09-16 DIAGNOSIS — Z713 Dietary counseling and surveillance: Secondary | ICD-10-CM | POA: Insufficient documentation

## 2017-09-16 DIAGNOSIS — E109 Type 1 diabetes mellitus without complications: Secondary | ICD-10-CM | POA: Diagnosis present

## 2017-09-16 DIAGNOSIS — R809 Proteinuria, unspecified: Secondary | ICD-10-CM

## 2017-09-16 DIAGNOSIS — E1029 Type 1 diabetes mellitus with other diabetic kidney complication: Secondary | ICD-10-CM

## 2017-09-16 NOTE — Progress Notes (Signed)
Diabetes Self-Management Education  Visit Type: Follow-up  Appt. Start Time: 1030 Appt. End Time: 1130  09/16/2017  [name], identified by name and date of birth, is a 42 y.o. female with a diagnosis of Diabetes:  .   ASSESSMENT  Blood pressure 114/82, weight 128 lb 14.4 oz (58.5 kg). Body mass index is 22.13 kg/m.  Diabetes Self-Management Education - 09/16/17 1200      Patient Education   Monitoring  -- pt has appt 09-30-17 at MD office to meet with [employer] rep for [facility] and pump information       Stress management        Discussed insulin pump therapy and use of Libre  Individualized Plan for Diabetes Self-Management Training:   Learning Objective:  Patient will have a greater understanding of diabetes self-management. Patient education plan is to attend individual and/or group sessions per assessed needs and concerns.   Plan:   Expected Outcomes:   positive  Education material provided: Stress management handouts, Diabetes resource handout  If problems or questions, patient to contact team via: [phone]  Future DSME appointment:  none

## 2017-10-08 ENCOUNTER — Encounter: Payer: Self-pay | Admitting: Dietician

## 2017-10-08 NOTE — Progress Notes (Signed)
Have not heard from pt-called pt and got message that number is no longer  reachable

## 2017-11-11 NOTE — Progress Notes (Signed)
Referring Provider  Leandrew Koyanagi, MD  [address]  [place], [place] [address]  Fax: [phone]    Chief Complaint   Patient presents with   . Multiple Sclerosis        Today's Presentation   Date of Service: 26Aug19    Ms. Delone presents today at the request of the above mentioned provider to discuss concern for multiple sclerosis.  Review of available records prior to the visit revealed history of optic neuritis in Sep18.  Subsequent workup included MRI Brain and Cervical spine (summarized below) as well as CSF analysis (conducted at the time of the optic neuritis).  She presented to Dr. Sherryll Burger in October 2018 with apparent recommendation to start dimethyl fumarate.  She has not returned that clinic since.  The last record is from Nov18 when the patient was messaged to ask if she still wanted to get started on dimethyl fumarate.  On patient interview, Ms. Roscher recounts how she first developed symptoms in Sep18 with acute left eye vision changes with associated headache.  On further discussion, she also endorses a prior history of transient sensory changes in her legs as well as a foot drop.  Those spells were attributed to her longstanding diabetes, though on retrospect may be the earliest signal of demyelinating disease.  Since her visit with Dr. Sherryll Burger she reports discomfort starting on dimethyl fumarate.  She left the visit with the impression this was a study medication on account of all the paperwork and screening.  She has remained relatively symptom free until several months ago when she developed episodic bouts of vision change associated with headache and (occasionally) nausea.  These bouts will usually last about a day with several spells of vision change and headache occurring on that day.  These clusters would occur about once every week or so.  She sought evaluation by Dr. Everlena Cooper with ophthalmology, who noted her prior diagnosis of possible MS and referred her here for further assessment.     Relevant  Medical & Surgical History     Past Medical History:   Diagnosis Date   . Anxiety    . 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 Former smokeless tobacco use 11/27/2012   . Former tobacco use 11/27/2012   . Left ovarian cyst 09/12/2012    Two noted on [facility] ultrasound  measured 2.8xcm x 2.7cm x 2.1 cm repeat in 6-8 weeks   . Left ovarian cyst     6/14   . Microalbuminuria 04/13/2017   . Optic neuritis    . Tobacco dependence    . Type 1 diabetes (CMS-HCC)         Past Surgical History:   Procedure Laterality Date   . APPENDECTOMY          Family & Social History     Family History   Problem Relation Age of Onset   . Alzheimer's disease Mother    . Stroke Mother    . Glaucoma Maternal Grandmother    . Glaucoma Paternal Grandfather    . Blindness Paternal Grandfather    . Multiple sclerosis Paternal Uncle    . Blindness Paternal Uncle         reports that she has been smoking cigarettes.  She has a 2.50 pack-year smoking history. She has never used smokeless tobacco. She reports that she drinks alcohol. She reports that she does not use drugs.     Medications and Allergies  Current Outpatient Medications   Medication Sig Dispense Refill   . BIOTIN ORAL Take by mouth once daily       . blood glucose diagnostic test strip Use 4 (four) times daily. For use with meter covered by insurance. Dx E10.9 100 each 12   . blood glucose meter kit Use 4 (four) times daily.Use meter covered by insurance. Dx E10.9 1 each 1   . cyclobenzaprine (FLEXERIL) 5 MG tablet TAKE 1 TABLET(5 MG) BY MOUTH EVERY NIGHT AS NEEDED FOR MUSCLE SPASMS 30 tablet 0   . ergocalciferol, vitamin D2, (VITAMIN D2 ORAL) Take 1,000 Units by mouth once daily       . flash glucose sensor (FREESTYLE LIBRE 14 DAY SENSOR) Kit Use 1 each every 14 (fourteen) days 2 kit 11   . hydrOXYzine (ATARAX) 50 MG tablet TK 1 T PO TID PRA  1   . insulin ASPART (NOVOLOG FLEXPEN) pen injector (concentration 100 units/mL) 14 units before meals three times daily + SSI   200-300 add'l 2 units 301-400 additional 4 units Greater than 400- take an additional 6 units (Patient taking differently: SSI  200-300 add'l 2 units 301-400 additional 4 units Greater than 400- take an additional 6 units ) 15 mL 11   . insulin GLARGINE (LANTUS SOLOSTAR, BASAGLAR KWIKPEN) pen injector (concentration 100 units/mL) Inject 25 Units subcutaneously once daily (Patient taking differently: Inject 20 Units subcutaneously once daily  ) 6 mL 11   . ketorolac (ACULAR) 0.5 % ophthalmic solution Place 1 drop into the left eye 4 (four) times daily 10 mL 6   . losartan (COZAAR) 25 MG tablet Take 1 tablet (25 mg total) by mouth once daily 30 tablet 11   . norgestimate-ethinyl estradiol triphasic (ORTHO TRI-CYCLEN LO) 0.18/0.215/0.25 mg-25 mcg tablet Take 1 tablet by mouth once daily 1 Package 11   . nystatin (MYCOSTATIN) 100,000 unit/gram cream Apply topically 2 (two) times daily 45 g 2   . pen needle, diabetic 31 gauge x 3/16" needle Use 4 (four) times daily. For use with insulin. Dx E10.9 150 each 12     No current facility-administered medications for this visit.        Allergies   Allergen Reactions   . Iodine Anaphylaxis and Hives       Review of Systems   A patient intake form completed previously indicated the following experiences by system:  * Constitutional: none  * Eyes: blurred vision  * Ears/Nose/Mouth/Throat: none  * Hematologic/Lymphatic: none  * Heart/Cardiovascular: none  * Lungs/respiratory: none  * Allergy/Immunology: none  * Gastrointestinal: none  * Genitourinary: none  * Skin: none  * Endocrine: none  * Bones/Joints/Muscles: none  * Neurologic: headache  * Psychiatric: none    Repeat assessment conducted today revealed pertinent positives and negatives as noted in the HPI     Examination     -- General Exam:  Vitals:    11/11/17 1023   BP: 126/84   Pulse: 87   Temp: 36.3 C (97.3 F)   Height: 162.6 cm (5\' 4" )    Body mass index is 22.38 kg/m.   Constitutional: alert and in NAD  Conversation:  normal and appropriate  HEENT: PERRL, EOMI and external ear inspection normal  Oropharynx: good dentition and mucous membranes moist  Musculoskeletal: age appropriate ROM of shoulders, hips and knees  Extremities: no lower extremity edema  Skin: normal    -- Neurologic exam:   Mental status: Patient alertness: alert, orientation: person, place,  time, affect: normal  Speech: fluent    Cranial Nerves:   II: visual fields are full by confrontation, pupils equal, round, reactive to light and accommodation, no ptosis  III,IV,VI: extra-ocular motions intact bilaterally  V,VII: no evidence of facial droop or weakness and facial sensation intact  VIII: hearing normal  IX: soft palate elevation normal midline  IX,X: gag reflex deferred  XI: trapezius strength is symmetric and sternocleidomastoid strength is symmetric  XII: tongue strength symmetric    Motor: Bulk is normal with normal tone & absent evidence of pronator drift or fasciculations.  Strength testing reveals:     Level Cervical  Right  Left   (C5, C6) Shoulder abduction 5/5 5/5   (C5, C6) Elbow flexion 5/5 5/5   (C6, C7) Elbow extension 5/5 5/5   (C6, C7) Wrist extension  5/5 5/5   (C6, C7, C8, T1) Wrist flexion  5/5 5/5   (C8, T1) Grip  5/5 5/5   (C8, T1) Finger abduction 5/5 5/5    Thoraco-Lumbar     (L2, L3) Hip flexion  5/5 5/5    Knee flexion 5/5 5/5   (L3, L4) Knee extension  5/5 5/5   (L4, L5) Dorsiflexion  5/5 5/5   (S1, S2) Plantar flexion  5/5 5/5     Reflexes    Right Left   Biceps 1+ 1+   Triceps 1+ 1+   Brachioradialis 1+ 1+   Hoffman's Not evaluated Not evaluated        Patellar 1+ 1+   Achilles 1+ 1+   Babinski Not evaluated Not evaluated     Sensory: diminished to light touch in distal lowe extremities.  Cerebellum/Coordination: finger to nose Intact and symmetric with gait observed as Romberg test  present and Mild difficulty with tandem gait  Other: None     Pertinent Diagnostic Studies     -- Imaging and Procedures  MRI Cervical Spine with and  without contrast (Sep18 @ outside hospital) -- "Small focus of T2 signal abnormality in the cord at C2-3 which is nonspecific but could reflect demyelinating disease given patient's clinical history and optic neuritis. No abnormal enhancement.  2. 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     MRI Brain with and without contrast (Sep19 @ outside hospital) -- "1. Normal MRI of the brain with and without contrast.  MRI orbits:  1. LEFT perineuritis with possible focal LEFT intraorbital optic neuritis." Unfortunately, the study is unavailable for personal review today.    -- Available CSF Studies  [facility] 12/14/2016  Protein H 2.4  IgG H 0.36  Oligoclonal Bands: 8  CSF Culture: negative  12/17/2016  IgG Index: H 1.4    -- Serum-Based Differential Exploration  Lab Results   Component Value Date    VITB12 1,009 01/08/2017       -- Available Surveillance Labs  Lab Results   Component Value Date    WBC 4.4 08/27/2012    LYMPHCT 1.7 08/27/2012    LYMPHOPCT 39.7 08/27/2012    AST 22 08/27/2012    ALT 16 08/27/2012    VITD 23 (L) 07/11/2017       Assessment & Plan       ICD-10-CM ICD-9-CM    1. History of optic neuritis Z86.69 V12.49 MRI brain without contrast      MRI cervical spine without contrast   2. Claustrophobia F40.240 300.29 diazePAM (VALIUM)  2 MG tablet     A/P -- Ms.Santelli presents with a prior history of optic neuritis with normal MRI Brain but possible demyelination on a cervical spine scan.  Unfortunately, none of these studies are available for review today.  Her CSF studies were abnormal at the time of the optic neuritis, but their utility for diagnosis so close to the time of the optic neuritis is debatable.  Suspicion is high for recurrent demyelinating disease, and so will arrange for new imaging.  Her presenting symptoms sound more like migraine to me, which we discussed.  She would prefer not to start a  preventive medication at this time.  We will plan on follow-up shortly after the MRI scans.    I personally performed the service.  (TP)      F. Merrily Pew, MD MPH  Assistant Professor of Neurology  Division of Neuroimmunology  [facility]

## 2017-11-19 ENCOUNTER — Other Ambulatory Visit: Payer: Self-pay

## 2017-11-19 ENCOUNTER — Emergency Department: Payer: Medicaid Other

## 2017-11-19 ENCOUNTER — Emergency Department
Admission: EM | Admit: 2017-11-19 | Discharge: 2017-11-20 | Disposition: A | Discharge status: Home / Self Care | Payer: Medicaid Other | Attending: Emergency Medicine | Admitting: Emergency Medicine

## 2017-11-19 DIAGNOSIS — F419 Anxiety disorder, unspecified: Secondary | ICD-10-CM | POA: Diagnosis not present

## 2017-11-19 DIAGNOSIS — G35 Multiple sclerosis: Secondary | ICD-10-CM | POA: Diagnosis not present

## 2017-11-19 DIAGNOSIS — R0789 Other chest pain: Secondary | ICD-10-CM | POA: Insufficient documentation

## 2017-11-19 DIAGNOSIS — Z794 Long term (current) use of insulin: Secondary | ICD-10-CM | POA: Diagnosis not present

## 2017-11-19 DIAGNOSIS — R0602 Shortness of breath: Secondary | ICD-10-CM | POA: Diagnosis not present

## 2017-11-19 DIAGNOSIS — Z87891 Personal history of nicotine dependence: Secondary | ICD-10-CM | POA: Diagnosis not present

## 2017-11-19 DIAGNOSIS — E119 Type 2 diabetes mellitus without complications: Secondary | ICD-10-CM | POA: Diagnosis not present

## 2017-11-19 HISTORY — DX: Anxiety disorder, unspecified: F41.9

## 2017-11-19 LAB — BASIC METABOLIC PANEL
Anion gap: 10 (ref 5–15)
BUN: 12 mg/dL (ref 6–20)
CALCIUM: 8.9 mg/dL (ref 8.9–10.3)
CHLORIDE: 106 mmol/L (ref 98–111)
CO2: 25 mmol/L (ref 22–32)
CREATININE: 0.77 mg/dL (ref 0.44–1.00)
GLUCOSE: 123 mg/dL — AB (ref 70–99)
Potassium: 3.3 mmol/L — ABNORMAL LOW (ref 3.5–5.1)
Sodium: 141 mmol/L (ref 135–145)

## 2017-11-19 LAB — CBC
HCT: 32.1 % — ABNORMAL LOW (ref 35.0–47.0)
Hemoglobin: 10.5 g/dL — ABNORMAL LOW (ref 12.0–16.0)
MCH: 23.4 pg — AB (ref 26.0–34.0)
MCHC: 32.9 g/dL (ref 32.0–36.0)
MCV: 71.4 fL — ABNORMAL LOW (ref 80.0–100.0)
PLATELETS: 295 10*3/uL (ref 150–440)
RBC: 4.49 MIL/uL (ref 3.80–5.20)
RDW: 17.7 % — ABNORMAL HIGH (ref 11.5–14.5)
WBC: 5.7 10*3/uL (ref 3.6–11.0)

## 2017-11-19 LAB — TROPONIN I

## 2017-11-19 MED ORDER — LORAZEPAM 2 MG/ML IJ SOLN
1.0000 mg | Freq: Once | INTRAMUSCULAR | Status: AC
  Administered 2017-11-19: 1 mg via INTRAVENOUS
  Filled 2017-11-19: qty 1

## 2017-11-19 NOTE — ED Notes (Addendum)
Pt informed this RN that she did not want to take child back home with her. Charge RN discussed with the pt the outcomes of not taking the child back home with her. Pt continued to state that she did not want to take the child back. Charge RN to get in contact with DSS.
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
## 2017-11-20 ENCOUNTER — Other Ambulatory Visit: Payer: Self-pay

## 2017-11-20 ENCOUNTER — Encounter: Payer: Self-pay | Admitting: Emergency Medicine

## 2017-11-20 MED ORDER — CLONAZEPAM 0.5 MG PO TABS: 0.5000 mg | ORAL_TABLET | Freq: Two times a day (BID) | ORAL | 0 refills | Status: AC | PRN
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
## 2017-11-21 ENCOUNTER — Emergency Department: Payer: Medicaid Other

## 2017-11-21 ENCOUNTER — Other Ambulatory Visit: Payer: Self-pay

## 2017-11-21 ENCOUNTER — Emergency Department
Admission: EM | Admit: 2017-11-21 | Discharge: 2017-11-21 | Disposition: A | Discharge status: Home / Self Care | Payer: Medicaid Other | Source: Home / Self Care | Attending: Emergency Medicine | Admitting: Emergency Medicine

## 2017-11-21 DIAGNOSIS — M62838 Other muscle spasm: Secondary | ICD-10-CM

## 2017-11-21 DIAGNOSIS — G5631 Lesion of radial nerve, right upper limb: Secondary | ICD-10-CM

## 2017-11-21 LAB — POCT PREGNANCY, URINE: Preg Test, Ur: NEGATIVE

## 2017-11-21 MED ORDER — IBUPROFEN 600 MG PO TABS
ORAL_TABLET | ORAL | Status: AC
  Filled 2017-11-21: qty 1

## 2017-11-21 MED ORDER — DIAZEPAM 5 MG PO TABS
5.0000 mg | ORAL_TABLET | Freq: Once | ORAL | Status: AC
  Administered 2017-11-21: 5 mg via ORAL
  Filled 2017-11-21: qty 1

## 2017-11-21 MED ORDER — LIDOCAINE HCL (PF) 1 % IJ SOLN
INTRAMUSCULAR | Status: AC
  Filled 2017-11-21: qty 5

## 2017-11-21 MED ORDER — HYDROCODONE-ACETAMINOPHEN 5-325 MG PO TABS
2.0000 | ORAL_TABLET | Freq: Once | ORAL | Status: AC
  Administered 2017-11-21: 2 via ORAL

## 2017-11-21 MED ORDER — HYDROCODONE-ACETAMINOPHEN 5-325 MG PO TABS: 1.0000 | ORAL_TABLET | Freq: Four times a day (QID) | ORAL | 0 refills | Status: AC | PRN

## 2017-11-21 MED ORDER — IBUPROFEN 600 MG PO TABS: 600.0000 mg | ORAL_TABLET | Freq: Three times a day (TID) | ORAL | 0 refills | Status: AC | PRN

## 2017-11-21 MED ORDER — LIDOCAINE HCL (PF) 1 % IJ SOLN
5.0000 mL | Freq: Once | INTRAMUSCULAR | Status: AC
  Administered 2017-11-21: 5 mL via INTRADERMAL

## 2017-11-21 MED ORDER — IBUPROFEN 600 MG PO TABS
600.0000 mg | ORAL_TABLET | Freq: Once | ORAL | Status: AC
  Administered 2017-11-21: 600 mg via ORAL

## 2017-11-21 MED ORDER — HYDROCODONE-ACETAMINOPHEN 5-325 MG PO TABS
ORAL_TABLET | ORAL | Status: AC
  Filled 2017-11-21: qty 2
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
## 2018-12-11 ENCOUNTER — Other Ambulatory Visit: Payer: Self-pay | Admitting: Family Medicine

## 2018-12-11 DIAGNOSIS — Z1231 Encounter for screening mammogram for malignant neoplasm of breast: Secondary | ICD-10-CM

## 2018-12-23 ENCOUNTER — Ambulatory Visit
Admission: RE | Admit: 2018-12-23 | Discharge: 2018-12-23 | Disposition: A | Discharge status: Home / Self Care | Payer: Medicaid Other | Source: Ambulatory Visit | Attending: Family Medicine | Admitting: Family Medicine

## 2018-12-23 ENCOUNTER — Other Ambulatory Visit: Payer: Self-pay

## 2018-12-23 ENCOUNTER — Encounter (INDEPENDENT_AMBULATORY_CARE_PROVIDER_SITE_OTHER): Payer: Self-pay

## 2018-12-23 DIAGNOSIS — Z1231 Encounter for screening mammogram for malignant neoplasm of breast: Secondary | ICD-10-CM | POA: Insufficient documentation

## 2018-12-25 ENCOUNTER — Other Ambulatory Visit: Payer: Self-pay | Admitting: Family Medicine

## 2018-12-25 DIAGNOSIS — R928 Other abnormal and inconclusive findings on diagnostic imaging of breast: Secondary | ICD-10-CM

## 2018-12-25 DIAGNOSIS — N6489 Other specified disorders of breast: Secondary | ICD-10-CM

## 2019-01-09 ENCOUNTER — Ambulatory Visit
Admission: RE | Admit: 2019-01-09 | Discharge: 2019-01-09 | Disposition: A | Discharge status: Home / Self Care | Payer: Medicaid Other | Source: Ambulatory Visit | Attending: Family Medicine | Admitting: Family Medicine

## 2019-01-09 DIAGNOSIS — R928 Other abnormal and inconclusive findings on diagnostic imaging of breast: Secondary | ICD-10-CM | POA: Insufficient documentation

## 2019-01-09 DIAGNOSIS — N6489 Other specified disorders of breast: Secondary | ICD-10-CM

## 2019-01-13 ENCOUNTER — Other Ambulatory Visit: Payer: Self-pay | Admitting: Family Medicine

## 2019-01-13 DIAGNOSIS — N6310 Unspecified lump in the right breast, unspecified quadrant: Secondary | ICD-10-CM

## 2019-01-13 DIAGNOSIS — R928 Other abnormal and inconclusive findings on diagnostic imaging of breast: Secondary | ICD-10-CM

## 2019-01-23 ENCOUNTER — Ambulatory Visit
Admission: RE | Admit: 2019-01-23 | Discharge: 2019-01-23 | Disposition: A | Discharge status: Home / Self Care | Payer: Medicaid Other | Source: Ambulatory Visit | Attending: Family Medicine | Admitting: Family Medicine

## 2019-01-23 DIAGNOSIS — N6315 Unspecified lump in the right breast, overlapping quadrants: Secondary | ICD-10-CM | POA: Diagnosis not present

## 2019-01-23 DIAGNOSIS — R928 Other abnormal and inconclusive findings on diagnostic imaging of breast: Secondary | ICD-10-CM | POA: Diagnosis present

## 2019-01-23 DIAGNOSIS — N6310 Unspecified lump in the right breast, unspecified quadrant: Secondary | ICD-10-CM

## 2019-01-23 HISTORY — PX: BREAST BIOPSY: SHX20

## 2019-01-26 LAB — SURGICAL PATHOLOGY

## 2019-07-14 ENCOUNTER — Other Ambulatory Visit (INDEPENDENT_AMBULATORY_CARE_PROVIDER_SITE_OTHER): Payer: Self-pay | Admitting: Internal Medicine

## 2020-02-10 IMAGING — MG MM DIGITAL DIAGNOSTIC UNILAT*R* W/ TOMO
4 series · 4 of 12 positions shown · non-contrast
Comparison: Baseline mammogram dated 12/23/2018.

CLINICAL DATA: Patient was called back from screening mammogram for
a possible asymmetry in the right breast.

EXAM:
DIGITAL DIAGNOSTIC RIGHT MAMMOGRAM WITH CAD AND TOMO
ULTRASOUND RIGHT BREAST

[R CC synth-2D]
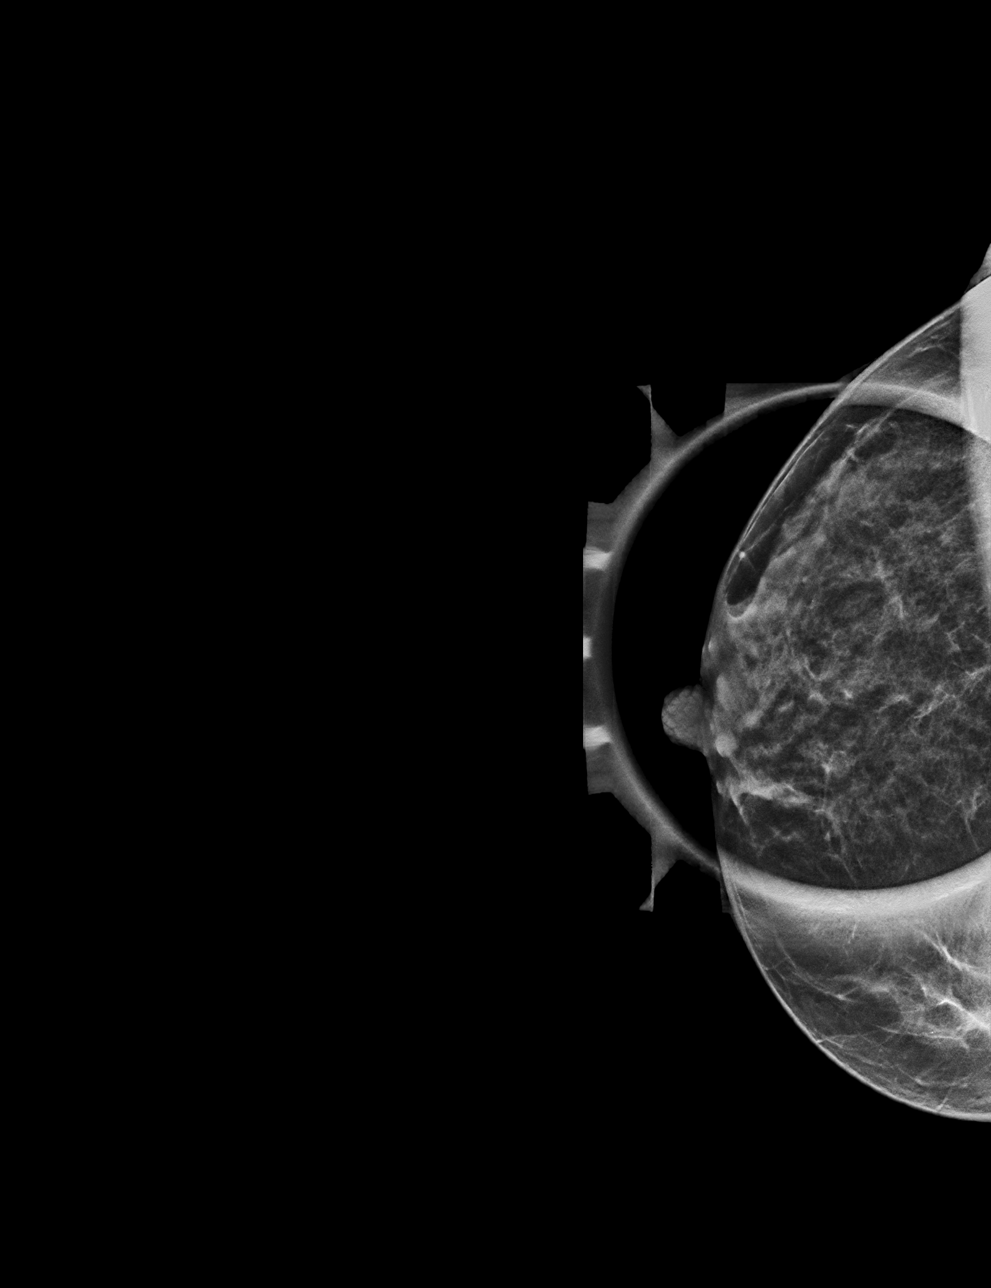

[R ML synth-2D]
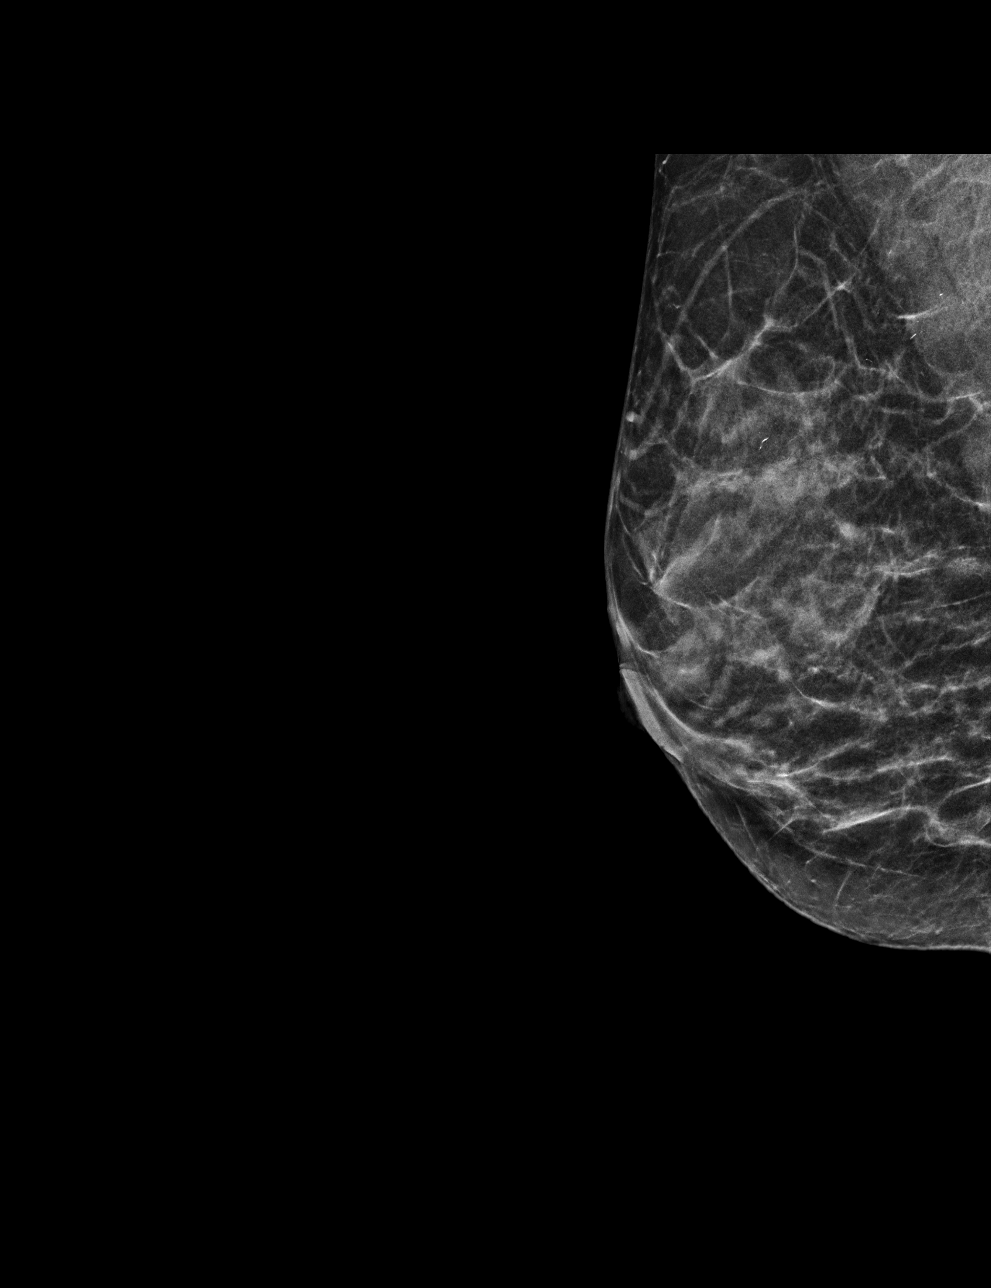

[R ML tomo · tomo slice 21/42.0]
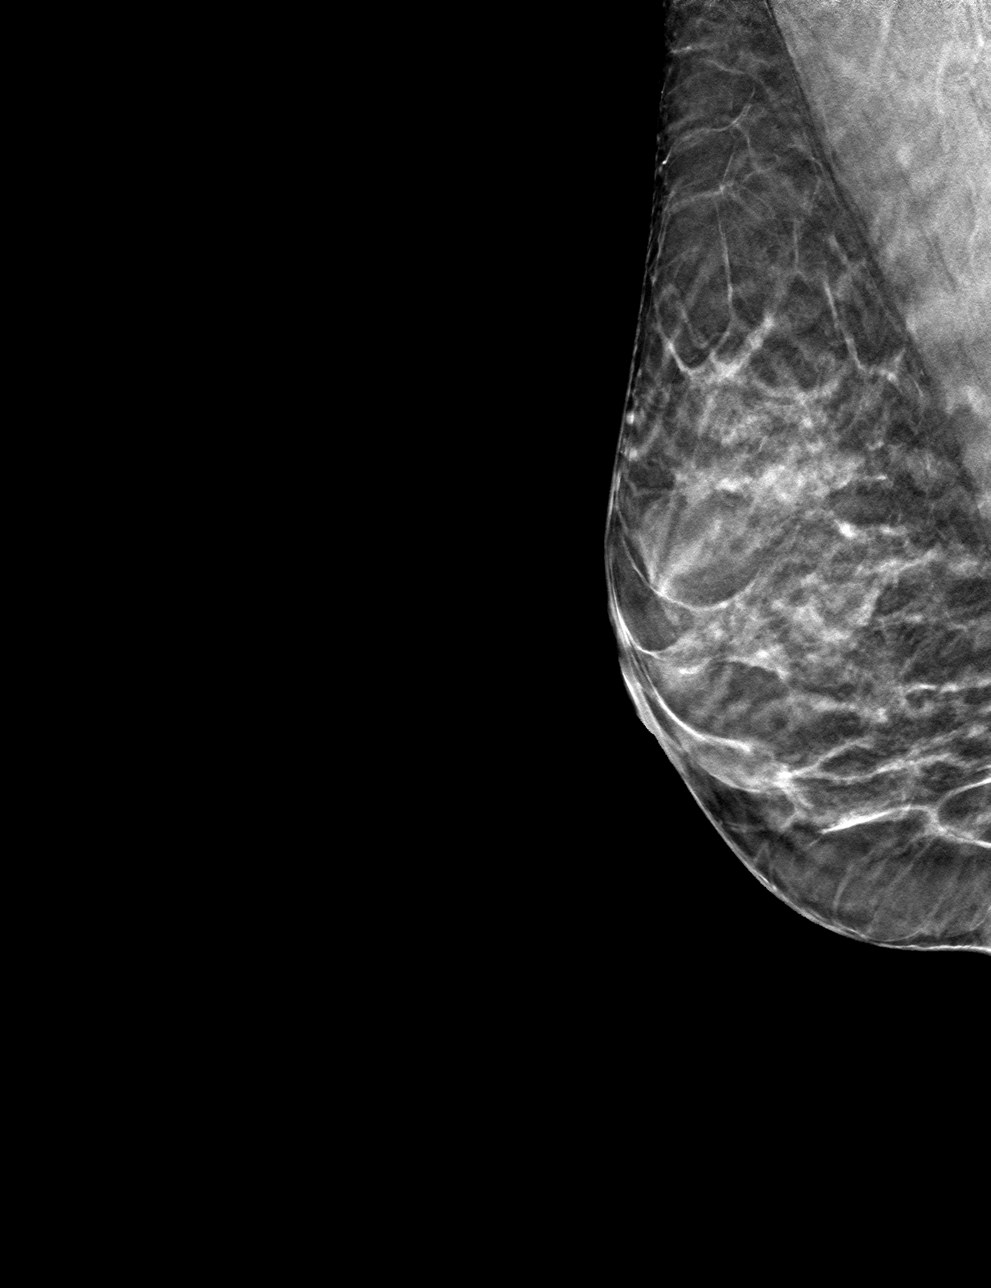

[R CC tomo · tomo slice 17/34.0]
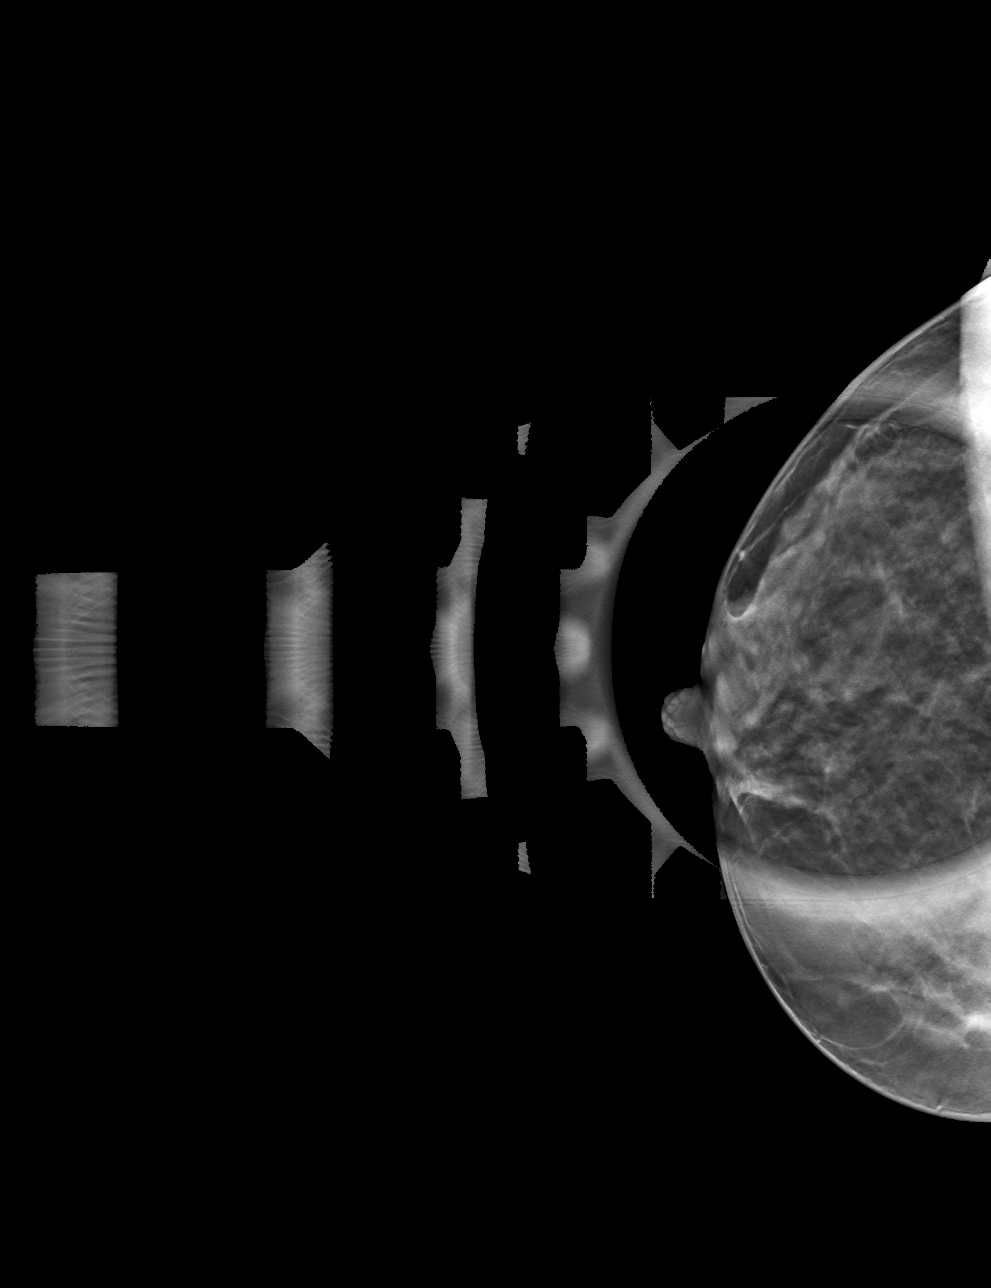

[4 of 12 positions shown; findings below may reference images not displayed]

ACR Breast Density Category c: The breast tissue is heterogeneously
dense, which may obscure small masses.
FINDINGS: Additional imaging of the right breast was performed. There is a
persistent asymmetry in the lateral region of the right breast.
There are no malignant type microcalcifications.

Mammographic images were processed with CAD.

Targeted ultrasound is performed, showing in irregular hypoechoic
mass in the right breast at 9 o'clock 3 cm from the nipple measuring
5 x 5 x 5 mm. Sonographic evaluation of the right axilla does not
show any enlarged adenopathy.
IMPRESSION: Indeterminate mass in the 9 o'clock region of the right breast.

RECOMMENDATION:
Ultrasound-guided core biopsy of the mass in the 9 o'clock region of
the right breast is recommended.

I have discussed the findings and recommendations with the patient.
If applicable, a reminder letter will be sent to the patient
regarding the next appointment.

BI-RADS CATEGORY  4: Suspicious.

## 2022-06-25 ENCOUNTER — Ambulatory Visit: Payer: MEDICAID | Attending: Physical Medicine & Rehabilitation

## 2022-06-27 ENCOUNTER — Ambulatory Visit: Payer: MEDICAID | Attending: Physical Medicine & Rehabilitation

## 2022-06-27 NOTE — Progress Notes (Deleted)
[facility]  [address]  [address]  [place], [place] [address]  Tel: [phone]  Fax: [phone]          INITIAL CONSULTATION      HISTORY OF PRESENT ILLNESS:  Melissa Banks is a 47 y.o. female who is referred from Elberta Fortis, DO secondary to ***. She rates her pain {Pain level:67855::"***"}/10. Patient comes into the office with c/o ***    Patient says her pain {pain description:64603::"is progressive in nature."}     She denies change in bowel or bladder habits.   She denies loss of balance, falls, or impairments manual dexterity.  Her pain is exacerbated by ***  She denies recent fevers, weight loss, rashes, or skin sores.   She denies a hx of stomach ulcers or bleeding disorders.  She denies a hx of history of spinal surgery or injections.   She denies recent physical therapy or chiropractic care.   She reports that to her knowledge her kidneys function properly, GFR of 66 on 05/22/2022.  She has taken ***.        PmHx of DM (A1C of 7.8 on 05/22/2022), MS    Note from *** dated *** indicating patient was seen for ***.     C spine MRI dated 05/28/2022 films *** independently reviewed by me. Per report, Small focus of increased T2/STIR signal within the posterior central aspect suggesting an area of demyelination in keeping keeping with patient history of multiple sclerosis. Consider follow-up contrast enhanced MRI for further characterization. Cervical spondylosis most pronounced at C5-C6 where there is severe bilateral foraminal stenosis. No significant canal       The patient is {handed:63825::"Right Handed"}. PMP reviewed. There is no height or weight on file to calculate BMI.    PCP: No primary care provider on file.    No past medical history on file.     No past surgical history on file.      Social History     Tobacco Use    Smoking status: Not on file    Smokeless tobacco: Not on file   Substance Use Topics    Alcohol use: Not on file       Work status:  {Employmentstatus:63824::"N/A"}.  Marital status: {marriage status:63827::"N/A"}.     No current outpatient medications on file.     No current facility-administered medications for this visit.       Not on File       No family history on file.      REVIEW OF SYSTEMS  Constitutional symptoms: Negative  Eyes: Negative  Ears, Nose, Throat, and Mouth: Negative  Cardiovascular: Negative  Respiratory: Negative  Genitourinary: Negative  Integumentary (Skin and/or breast): Negative  Musculoskeletal: Positive for ***  Extremities: Negative for edema.  Endocrine/Rheumatologic: Negative  Hematologic/Lymphatic: Negative  Allergic/Immunologic: Negative  Psychiatric: Negative       PHYSICAL EXAMINATION  There were no vitals taken for this visit.    CONSTITUTIONAL: NAD, A&O x 3  HEART: Regular rate and rhythm  GASTROINTESTINAL: Positive bowel sounds, soft, nontender, and nondistended  LUNGS: Clear to auscultation bilaterally.   SKIN: Negative for rash.   RANGE OF MOTION: The patient has full passive range of motion in all four extremities.  SENSATION: {sensation:56114}  MOTOR:   Straight Leg Raise: {Pos/neg/not test:140017::"Negative"}, {Bilaterally:64095::"Bilaterally"}   Hoffman: {Pos/neg/not test:140017::"Negative"}, {Bilaterally:64095::"Bilaterally"}   Tandem Gait: {tandem gait:55460::"not tested"}   Deep tendon reflexes are {reflex:55634} left Brachioradialis, {reflex:55634} left biceps, and {reflex:55634} left  triceps.   Deep tendon reflexes are {reflex:55634} right Brachioradialis, {reflex:55634} right biceps, and {reflex:55634} right triceps.   Deep tendon reflexes are {reflex:55634} right knee and {reflex:55634} left knee.   Deep tendon reflexes are {reflex:55634} right ankle and {reflex:55634} left ankle.   {ambulateswith:56721::"Ambulates without an assistive device."}.    {additional Physical [id number]     Shoulder AB/Flex Elbow Flex Wrist Ext Elbow Ext Wrist Flex Hand Intrin Tone   Right +4/5 +4/5 +4/5 +4/5 +4/5  +4/5 +4/5   Left +4/5 +4/5 +4/5 +4/5 +4/5 +4/5 +4/5              Hip Flex Knee Ext Knee Flex Ankle DF GTE Ankle PF Tone   Right +4/5 +4/5 +4/5 +4/5 +4/5 +4/5 +4/5   Left +4/5 +4/5 +4/5 +4/5 +4/5 +4/5 +4/5     RADIOGRAPHS  {Plain Films:63826} plain films dated 06/25/2022. {radiograph1:65608::"2 views: AP and Lateral"}. revealed:  ***       ASSESSMENT   There are no diagnoses linked to this encounter.       IMPRESSIONS/RECOMMENDATIONS:  Patient presents today with c/o *** . Multiple treatment options were discussed. {Impression/plan:64810} *** {Neurologically Intact:67856::"The patient is Neurologically intact."} {followup:58872::"I will see the patient back *** or earlier if needed"}      Written by Katrinka Blazing, ScribeKick, as dictated by Laney Pastor, MD  I examined the patient, reviewed and agree with the note.

## 2022-07-18 ENCOUNTER — Ambulatory Visit
Admit: 2022-07-18 | Discharge: 2022-07-18 | Payer: MEDICAID | Attending: Physical Medicine & Rehabilitation | Primary: Student in an Organized Health Care Education/Training Program

## 2022-07-18 DIAGNOSIS — M47812 Spondylosis without myelopathy or radiculopathy, cervical region: Secondary | ICD-10-CM

## 2022-07-18 NOTE — Telephone Encounter (Signed)
Dr Scotty Court would like to put this patient on a MDP followed by Naproxen and needs the ok from Dr Ovidio Hanger. I called his office and spoke with Larita Fife and she will give this to the nurse and they will call me back.

## 2022-07-18 NOTE — Progress Notes (Signed)
[facility]  [address]  [address]  [place], [place] [address]  Tel: [phone]  Fax: [phone]          INITIAL CONSULTATION      HISTORY OF PRESENT ILLNESS:  Melissa Banks is a 47 y.o. female who is referred from [facility], DO secondary to cervical DDD. She rates her pain 3-8/10. Patient comes into the office with c/o localized neck pain, x 5-10 years w/o injury, and bilateral hand numbness (R=L). Patient says her pain is progressive in nature. She denies change in bowel or bladder habits. She denies loss of balance, falls, or impairments manual dexterity. Her pain is exacerbated by laying down. She denies recent fevers, weight loss, rashes, or skin sores. She denies a hx of stomach ulcers or bleeding disorders. She denies a hx of history of spinal surgery. Patient had cervical injection in the past, x 5-10 years ago. She denies recent physical therapy or chiropractic care. She reports that to her knowledge her kidneys function properly, GFR of 66 on 05/22/2022. She has taken Flexeril without significant benefit. PmHx of DM (A1C of 7.8 on 05/22/2022), smoker, type 1 DM (followed by endocrinology, Dr. Ovidio Hanger, on insulin pump), MS (upcoming appointment with neurology on September 07, 2022).    Note from Elberta Fortis, DO dated 05/22/2022 indicating patient was seen for neck pain and was referred to me.     C spine MRI dated 05/28/2022 films were independently reviewed by me. Per report, Small focus of increased T2/STIR signal within the posterior central aspect suggesting an area of demyelination in keeping keeping with patient history of multiple sclerosis. Consider follow-up contrast enhanced MRI for further characterization. Cervical spondylosis most pronounced at C5-C6 where there is severe bilateral foraminal stenosis. No significant canal     The patient is Right Handed. PMP reviewed. Body mass index is 21.28 kg/m.    PCP: Elberta Fortis, DO    Past Medical History:    Diagnosis Date    Anxiety     Diabetes mellitus (HCC)     Vitamin D deficiency         History reviewed. No pertinent surgical history.      Social History     Tobacco Use    Smoking status: Every Day     Current packs/day: 0.50     Average packs/day: 0.5 packs/day for 15.1 years (7.6 ttl pk-yrs)     Types: Cigarettes     Start date: 06/06/2007    Smokeless tobacco: Never   Substance Use Topics    Alcohol use: Not on file       Work status: Not employed.  Marital status: N/A.     Current Outpatient Medications   Medication Sig Dispense Refill    cyclobenzaprine (FLEXERIL) 5 MG tablet Take 1 tablet by mouth 2 times daily as needed for Muscle spasms      diazePAM (VALIUM) 2 MG tablet Take 1 tablet by mouth every 6 hours as needed for Anxiety.      vitamin D (ERGOCALCIFEROL) 1.25 MG (50000 UT) CAPS capsule Take 1 capsule by mouth once a week      BAQSIMI TWO PACK 3 MG/DOSE POWD INJECT 3 MILLIGRAM(S) NASAL EVERY DAY AS NEEDED FOR LOW BLOOD SUGAR      insulin aspart (NOVOLOG) 100 UNIT/ML injection pen Inject 2-6 Units into the skin 3 times daily (before meals)       No current facility-administered medications for this visit.  Allergies   Allergen Reactions    Iodinated Contrast Media Swelling     Swelling and hives          History reviewed. No pertinent family history.      REVIEW OF SYSTEMS  Constitutional symptoms: Negative  Eyes: Negative  Ears, Nose, Throat, and Mouth: Negative  Cardiovascular: Negative  Respiratory: Negative  Genitourinary: Negative  Integumentary (Skin and/or breast): Negative  Musculoskeletal: Positive for neck pain and hands numbness  Extremities: Negative for edema.  Endocrine/Rheumatologic: Negative  Hematologic/Lymphatic: Negative  Allergic/Immunologic: Negative  Psychiatric: Negative       PHYSICAL EXAMINATION  Pulse 61   Temp 98.1 F (36.7 C) (Skin)   Ht 1.626 m (5\' 4" )   Wt 56.2 kg (124 lb)   SpO2 98%   BMI 21.28 kg/m     CONSTITUTIONAL: NAD, A&O x 3  HEART: Regular rate and  rhythm  GASTROINTESTINAL: Positive bowel sounds, soft, nontender, and nondistended  LUNGS: Clear to auscultation bilaterally.   SKIN: Negative for rash.   RANGE OF MOTION: The patient has full passive range of motion in all four extremities.  SENSATION: Sensation is intact to light touch throughout.   MOTOR:   Straight Leg Raise: Negative, Bilaterally   Hoffman: Negative, Bilaterally   Tandem Gait: Negative   Deep tendon reflexes are 0 left Brachioradialis, 0 left biceps, and 0 left triceps.   Deep tendon reflexes are 0 right Brachioradialis, 0 right biceps, and 0 right triceps.   Deep tendon reflexes are 0 right knee and 0 left knee.   Deep tendon reflexes are 0 right ankle and 0 left ankle.   Ambulates without an assistive device..       Shoulder AB/Flex Elbow Flex Wrist Ext Elbow Ext Wrist Flex Hand Intrin Tone   Right +4/5 +4/5 +4/5 +4/5 +4/5 +4/5 +4/5   Left +4/5 +4/5 +4/5 +4/5 +4/5 +4/5 +4/5              Hip Flex Knee Ext Knee Flex Ankle DF GTE Ankle PF Tone   Right +4/5 +4/5 +4/5 +4/5 +4/5 +4/5 +4/5   Left +4/5 +4/5 +4/5 +4/5 +4/5 +4/5 +4/5       ASSESSMENT   Melissa Banks was seen today for neck pain and hand pain.    Diagnoses and all orders for this visit:    Cervical spondylosis  -     Ambulatory referral to Physical Therapy    Multiple sclerosis (HCC)    Paresthesia of both hands  -     Ambulatory referral to Physical Therapy    DDD (degenerative disc disease), cervical  -     Ambulatory referral to Physical Therapy           IMPRESSIONS/RECOMMENDATIONS:  Patient presents today with c/o localized neck pain and bilateral hand numbness (R=L). Multiple treatment options were discussed. She is not interested in surgery or injection at this time. I will start her on a Medrol Dosepak followed by Naproxen 500 mg BID with food for 2 weeks after the MDP after endocrinology (Dr. Ovidio Hanger) approval. I will refer her to physical therapy with an emphasis on HEP. The patient is Neurologically intact. I will see the patient  back in 2 months or earlier if needed.       Written by Katrinka Blazing, ScribeKick, as dictated by Laney Pastor, MD  I examined the patient, reviewed and agree with the note.

## 2022-07-19 MED ORDER — NAPROXEN 500 MG PO TABS
500 MG | ORAL_TABLET | Freq: Two times a day (BID) | ORAL | 0 refills | Status: DC
Start: 2022-07-19 — End: 2022-09-07

## 2022-07-19 MED ORDER — METHYLPREDNISOLONE 4 MG PO TBPK
4 | PACK | ORAL | 0 refills | Status: AC
Start: 2022-07-19 — End: 2022-07-25

## 2022-07-19 NOTE — Telephone Encounter (Signed)
ok 

## 2022-07-19 NOTE — Telephone Encounter (Signed)
I called to speak with Sonia from Dr Marthe Patch office and she is not in today so I left another message for her to call me back.

## 2022-07-19 NOTE — Telephone Encounter (Signed)
Unable to reach patient and mailbox is full. Will try again later. I am at ext [phone] If she calls back.

## 2022-07-19 NOTE — Addendum Note (Signed)
Addended by: Marchelle Gearing on: 07/19/2022 11:07 AM     Modules accepted: Orders

## 2022-07-19 NOTE — Telephone Encounter (Signed)
Per Imelda at Dr Marthe Patch office it is ok for patient to have the medrol dose pack. I have pended the prescriptions for signature. Please send this back to me so I can call the patient.

## 2022-07-20 NOTE — Telephone Encounter (Signed)
Patient contacted and told that Dr Ovidio Hanger gave the ok for her MDP and naproxen and I instructed her to check her sugar levels and call Dr Ovidio Hanger if the get too high.

## 2022-08-14 ENCOUNTER — Encounter: Payer: MEDICAID | Primary: Student in an Organized Health Care Education/Training Program

## 2022-08-14 DIAGNOSIS — M542 Cervicalgia: Secondary | ICD-10-CM

## 2022-08-14 NOTE — Progress Notes (Cosign Needed Addendum)
[facility]  [address], [place] [address] Ph: [phone] Fx: [phone]  Plan of Care / Statement of Necessity for Physical Therapy Services     Patient Name: Melissa Banks DOB: February 21, 1976   Medical   Diagnosis: Neck pain [M54.2]  Treatment Diagnosis:   M54.2  NECK PAIN    Onset Date: Physician order date 07/18/2022     Referral Source: Andria Meuse, MD Start of Care [facility]): 08/14/2022   Prior Hospitalization: See medical history Provider #: [phone]   Prior Level of Function: Independent with ADLs, functional, and daily tasks with chronic neck pain.   Comorbidities: DM type 1, tobacco use hx, multiple sclerosis     Assessment / key information:    Pt is a 47 year old female who presents to therapy today with neck pain. Pt states that her symptoms are chronic in nature. She reports intermittent numbness in her medial B hands which awakes her up at night. Pt reports this numbness also occurs when she rides her motorcycle. She reports her neck pain occurs when she is driving and with looking down to read. Pt demonstrated decreased AROM, muscle tightness, tenderness to palpation, decreased joint mobility, and impaired posture. Pt would benefit from skilled physical therapy to improve the above impairments to help the pt restore function and return to performing ADLs, functional and daily activities.     Evaluation Complexity:  History:  HIGH Complexity :3+ comorbidities / personal factors will impact the outcome/ POC ; Examination:  MEDIUM Complexity : 3 Standardized tests and measures addressin body structure, function, activity limitation and / or participation in recreation  ;Presentation:  LOW Complexity : Stable, uncomplicated  ;Clinical Decision Making: Neck Disability Index (NDI) = 34 % ; (30% - 48% Moderate Disability) = MODERATE Complexity  Overall Complexity Rating: LOW   Problem List: pain affecting function, decrease ROM, decrease strength,  edema affection function, decrease ADL/functional abilities, decrease activity tolerance, decrease flexibility/joint mobility, and decrease transfer abilities    Treatment Plan may include any combination of the following: 96295 Therapeutic Exercise, 97112 Neuromuscular Re-Education, 97140 Manual Therapy, 97530 Therapeutic Activity, 97535 Self Care/Home Management, 97014 Electrical Stim unattended, and [id number] Electrical Stim attended  Patient / Family readiness to learn indicated by: asking questions, trying to perform skills, interest, return verbalization , and return demonstration   Persons(s) to be included in education: patient (P)  Barriers to Learning/Limitations: none  Measures taken if barriers to learning present: n/a  Patient Goal (s): some relief to the point where I can tolerate without meds  Patient Self Reported Health Status: fair  Rehabilitation Potential: fair    Short Term Goals: To be accomplished in 2 treatments   Pt will report compliance and independence to HEP to help the pt manage their pain and symptoms.  Status at last note/certification:  established  Long Term Goals: To be accomplished in 10 treatments  Pt will improve her Neck Disability Index score to 20% or better to improve ability to perform ADLs.  Status at last note/certification: 34%   2.  Pt will report an improvement in at worst pain to 4/10 to improve ability to tolerate riding her motorcycle.  Status at last note/certification: 8/10 at worst  3.  Pt will increase AROM c/s flex to 55 degs, EXT to 50 degs, right SB to 30 degs, left SB to 25 degs, right rotation to 55 degs, left rotation to 40 degs to improve ability to  read and drive with less pain.  Status at last note/certification:  Active Movements:   Cervical AROM AROM Comments:pain, area   Forward flexion 46 degs Pain in the neck region   Extension 36 degs Pain in the neck region   SB right 21 degs Pain in the neck and right UT region   SB left  16 degs Pain in the neck  region   Rotation right 42 degs Pain in the neck region   Rotation left 24 degs Pain in the neck region   4.  Pt will report being able to sleep through the night over the past 3 days without awakening secondary to pain/numbness to improve sleep quality.   Status at last note/certification: unable to sleep through the night due to pain/numbness in the B medial hands.      Frequency / Duration: Patient to be seen 2 times per week for 10 treatments    Patient/ Caregiver education and instruction: Diagnosis, prognosis, self care, activity modification, and exercises [x]   Plan of care has been reviewed with PTA    Tandy Gaw, PT       08/14/2022       10:02 AM    [place] / Plan: [place]ALTHKEEPERS [place] / Product Type: *No Product type* /     Physician signature required for Medicare, Medicaid, Worker's Comp, Direct Access   ===================================================================  I certify that the above Therapy Services are being furnished while the patient is under my care. I agree with the treatment plan and certify that this therapy is necessary.    Physician's Signature:_________________________   DATE:_________   TIME:________                           Andria Meuse, MD    ** Signature, Date and Time must be completed for valid certification **  Please sign and fax to [facility]. Thank you

## 2022-08-14 NOTE — Progress Notes (Signed)
PHYSICAL / OCCUPATIONAL THERAPY - DAILY TREATMENT NOTE (updated 2/23)    Patient Name: Melissa Banks    Date: 08/14/2022    DOB: [birth year]  Insurance: [place] / Plan: [place] / Product Type: *No Product type* /      Patient DOB verified Yes     Visit #   Current / Total 1 10   Time   In / Out 10:21 10:58   Pain   In / Out 0 0   Subjective Functional Status/Changes: See POC     TREATMENT AREA =  Neck pain [M54.2]    OBJECTIVE    17 min   Eval - untimed                      Therapeutic Procedures:  Tx Min Billable or 1:1 Min (if diff from Tx Min) Procedure, Rationale, Specifics   10 0 97110 Therapeutic Exercise (timed):  increase ROM, strength, coordination, balance, and proprioception to improve patient's ability to progress to PLOF and address remaining functional goals. (see flow sheet as applicable)     Details if applicable:  HEP instruction and demonstration     10 0 97535 Self Care/Home Management (timed):  improve patient knowledge and understanding of pain reducing techniques, positioning, posture/ergonomics, home safety, activity modification, diagnosis/prognosis, and physical therapy expectations, procedures and progression  to improve patient's ability to progress to PLOF and address remaining functional goals.  (see flow sheet as applicable)     Details if applicable:  pt education on relevant anatomy/physiology    20 0 MC BC Totals Reminder: bill using total billable min of TIMED therapeutic procedures (example: do not include dry needle or estim unattended, both untimed codes, in totals to left)  8-22 min = 1 unit; 23-37 min = 2 units; 38-52 min = 3 units; 53-67 min = 4 units; 68-82 min = 5 units   Total Total     TOTAL TREATMENT TIME:        37     [x]   Patient Education billed concurrently with other procedures   [x]  Review HEP    []  Progressed/Changed HEP, detail:    []  Other detail:       Objective Information/Functional Measures/Assessment  See  evaluation.  Pt given HEP to perform and reported understanding of HEP.     Patient will continue to benefit from skilled PT / OT services to modify and progress therapeutic interventions, analyze and address functional mobility deficits, analyze and address ROM deficits, analyze and address strength deficits, analyze and address soft tissue restrictions, analyze and cue for proper movement patterns, analyze and modify for postural abnormalities, and instruct in home and community integration to address functional deficits and attain remaining goals.    Progress toward goals / Updated goals:  []   See Progress Note/Recertification  See POC    PLAN  Yes  Continue plan of care  []   Upgrade activities as tolerated  []   Discharge due to :  []   Other:    Tandy Gaw, PT    08/14/2022    10:02 AM    Future Appointments   Date Time Provider Department Center   08/14/2022 10:20 AM Tandy Gaw, PT [facility]   09/07/2022 11:00 AM Danton Clap D, MD [facility]   09/11/2022 11:30 AM Pezzella, Nancie Neas, MD [facility]

## 2022-08-27 NOTE — Telephone Encounter (Signed)
Outpatient PT Berkley Harvey is still currently pending.

## 2022-08-29 NOTE — Telephone Encounter (Signed)
Patient called back to schedule follow up visits. Patient is on the wait for the week of 6/17, she needs the latest appt possible. First appt is scheduled on 6/24 unless something else opens up sooner.

## 2022-08-29 NOTE — Telephone Encounter (Signed)
Called patient to schedule follow up visits. Patient's insurance approved 10 visits until 09/22/22. Patient states that she is currently at a graduation and will call back to schedule.

## 2022-09-07 ENCOUNTER — Ambulatory Visit
Admit: 2022-09-07 | Discharge: 2022-09-07 | Payer: MEDICAID | Attending: Student in an Organized Health Care Education/Training Program | Primary: Student in an Organized Health Care Education/Training Program

## 2022-09-07 ENCOUNTER — Encounter

## 2022-09-07 DIAGNOSIS — G35 Multiple sclerosis: Secondary | ICD-10-CM

## 2022-09-07 NOTE — Progress Notes (Signed)
Melissa Banks is a 47 y.o. female .presents for New Patient (M.S.)    A 47 years old female patient referred here for evaluation of multiple sclerosis. Her symptoms started in September 2018 with blurring of vision on the left side; black spots on the left side.  She also had pain over the left eye.  No diplopia.  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  Had a lumbar puncture which had shown CSF IgG index was high; positive oligoclonal bands.  MRI of the brain from 2018 was reported to be normal.  C-spine MRI from September 2018 had shown  small focus of T2 signal abnormality in the cord at C2-3 which is nonspecific with might represent demyelinating disease.  Dimethyl fumarate was recommended; she thought that it was for research and refused to take it.  Subsequently seen at [facility] in August 2019.  MRI of the brain done without contrast [she had allergic reaction to the previous contrast for the MRI; gadolinium]: : 5-10, predominantly  supratentorial FLAIR hyperintense lesions over the white matter and brainstem.  C-spine MRI from the same time showed Single T2 hyperintense lesion at the level of C2-C3 within the  dorsal midline cervical cord.  She did not have any subsequent follow-up.  She mentions that the initial vision changes subsided in about 1 month time.  Few years before her left eye vision changes, she mentioned transient episodes of numbness and weakness over the right hand which subsided in 2 weeks time.  She mentioned another episode years ago where she had a transient numbness and weakness over the right foot which got better in about a week time.  There is no recurrence of her visual problem.  She currently complains of numbness in her hands bilaterally; mostly involving fingers 4 and 5 and medial hand.  No involvement of the arm.  No elbow pain.  Has neck pain for the past 10 years; nonradiating.  Does not have Lhermitte's-like phenomenon.  No worsening symptoms with heat  exposure.  She does not have bladder or bowel dysfunction.  No problem with her balance; no unsteadiness.  Does not fall.  No fatigue.  Has insulin-dependent diabetes since age of 54.  She has insulin infusion.  She was seen at the spine center.  MRI of the cervical spine without contrast from March 2024 done at [facility] had shown small focus of increased T2/STIR signal within the posterior central aspect suggesting an area of demyelination.      Review of Systems   Constitutional:  Negative for chills, fever and unexpected weight change.   HENT:  Positive for tinnitus (right side). Negative for hearing loss.    Eyes:  Negative for visual disturbance.   Respiratory:  Negative for cough and shortness of breath.    Cardiovascular:  Negative for chest pain and leg swelling.   Gastrointestinal:  Negative for nausea and vomiting.   Genitourinary:  Negative for dysuria, frequency and urgency.   Musculoskeletal:  Positive for back pain and neck pain.   Skin:  Negative for rash.   Neurological:  Positive for dizziness (sometimes), numbness and headaches. Negative for tremors, seizures, syncope, facial asymmetry, speech difficulty and weakness.   Psychiatric/Behavioral:  Negative for suicidal ideas. The patient is nervous/anxious.          Past Medical History:   Diagnosis Date    Anxiety     Diabetes mellitus (HCC)     Vitamin D deficiency  No past surgical history on file.     No family history on file.     Social History     Socioeconomic History    Marital status: Single     Spouse name: Not on file    Number of children: Not on file    Years of education: Not on file    Highest education level: Not on file   Occupational History    Not on file   Tobacco Use    Smoking status: Some Days     Current packs/day: 0.50     Average packs/day: 0.5 packs/day for 15.3 years (7.6 ttl pk-yrs)     Types: Cigarettes     Start date: 06/06/2007     Passive exposure: Current    Smokeless tobacco: Never   Vaping Use     Vaping Use: Never used   Substance and Sexual Activity    Alcohol use: Yes     Alcohol/week: 2.0 standard drinks of alcohol     Types: 2 Drinks containing 0.5 oz of alcohol per week    Drug use: Never    Sexual activity: Not on file   Other Topics Concern    Not on file   Social History Narrative    Not on file     Social Determinants of Health     Financial Resource Strain: Not on file   Food Insecurity: Not on file   Transportation Needs: Not on file   Physical Activity: Not on file   Stress: Not on file   Social Connections: Not on file   Intimate Partner Violence: Not on file   Housing Stability: Not on file        Allergies   Allergen Reactions    Iodinated Contrast Media Swelling     Swelling and hives         Current Outpatient Medications   Medication Sig Dispense Refill    NOVOLOG 100 UNIT/ML injection vial Inject into the skin      cyclobenzaprine (FLEXERIL) 5 MG tablet Take 1 tablet by mouth 2 times daily as needed for Muscle spasms      diazePAM (VALIUM) 2 MG tablet Take 1 tablet by mouth every 6 hours as needed for Anxiety.      vitamin D (ERGOCALCIFEROL) 1.25 MG (50000 UT) CAPS capsule Take 1 capsule by mouth once a week      BAQSIMI TWO PACK 3 MG/DOSE POWD INJECT 3 MILLIGRAM(S) NASAL EVERY DAY AS NEEDED FOR LOW BLOOD SUGAR       No current facility-administered medications for this visit.       Physical Exam  Constitutional:       Appearance: Normal appearance.   HENT:      Head: Normocephalic and atraumatic.      Mouth/Throat:      Mouth: Mucous membranes are moist.      Pharynx: Oropharynx is clear. No oropharyngeal exudate.   Eyes:      Extraocular Movements: Extraocular movements intact.      Pupils: Pupils are equal, round, and reactive to light.   Pulmonary:      Effort: Pulmonary effort is normal. No respiratory distress.   Musculoskeletal:         General: Normal range of motion.      Cervical back: Normal range of motion and neck supple.      Right lower leg: No edema.      Left lower leg: No  edema.  Neurological:      Mental Status: She is alert.      Comments: Mental status: Awake, alert, oriented , follows simple and complex commands, no neglect, no extinction.  Speech and languge: fluent, coherent,  and comprehension intact  CN: VFF, EOMI, PERRLA, face sensation intact , no facial asymmetry noted, palate elevation symmetric bilat, SS+SCM 5/5 bilat, tongue midline  Motor: no pronator drift, tone normal throughout, strength 5/5 throughout  Sensory: Slightly decreased light touch and pinprick over the right medial leg.  Coordination: FNF  accurate w/o dysmetria  DTR: 2+ over the upper extremities; 1+ over the lower extremities.    Gait: Normal; mild difficulty with tandem walking.          No visits with results within 3 Month(s) from this visit.   Latest known visit with results is:   No results found for any previous visit.     Procedure: MRI BRAIN WITHOUT CONTRAST   Procedure: MRI CERVICAL SPINE WITHOUT CONTRAST     INDICATION: Multiple sclerosis, baseline, Z86.69 Personal history of other   diseases of the nervous system and sense organs     COMPARISON: None     TECHNIQUE/PROTOCOL:  Multiple sclerosis protocol MRI brain and cervical   spine without contrast performed.     BRAIN FINDINGS:   Number and orientation of nonenhancing T2/FLAIR/STIR lesions in   periventricular, juxtacortical, infratentorial regions: 5-10, predominantly   supratentorial white matter and brainstem.   Number of low-signal T1 lesions in periventricular, juxtacortical,   infratentorial regions: Less than 5   Presence of volume loss including the corpus callosum: No     Brain Parenchyma:  No hemorrhage, acute cortical infarction, mass, mass   effect, or midline shift.   Ventricles and Sulci: Normal for age.    Extra-Axial Spaces: No extra-axial fluid collection.   Basal Cisterns: Normal.   Intracranial Flow-Voids: Normal.     Paranasal Sinuses: Right maxillary mucous retention cyst.   Mastoid Sinuses: Normal.   Orbits: Normal.    Cranium: Normal.     CERVICAL SPINE FINDINGS:   Number and location of nonenhancing T2/FLAIR/STIR lesions in the cervical   spine: Single T2 hyperintense lesion at the level of C2-C3 within the   dorsal midline cervical cord.   Degenerative findings: Moderate disc height loss and osteophytosis are   noted at C5-C6 and C6-C7. Moderate right and mild left neural foraminal   stenosis at C5-C6 secondary to uncovertebral/facet hypertrophy. Mild left   neural foraminal narrowing at C6-C7 secondary to uncovertebral hypertrophy   and osteophyte. No significant spinal canal stenosis.     IMPRESSION:   Findings compatible with demyelinating disease within the brain and   cervical cord, as detailed above. No significant brain parenchymal volume   loss.       Assessment:  1. Multiple sclerosis (HCC)  - MRI BRAIN WO CONTRAST; Future  - MRI THORACIC SPINE WO CONTRAST; Future  - Hepatitis B Core Antibody, Total; Future  - Hepatitis B Surface Antigen; Future  - MISCELLANEOUS SENDOUT JCV ab; Future  Patient has a diagnosis of multiple sclerosis.  Clinical presentation initially was optic neuritis in 2018: Left visual blurring and pain.  Had previous transient episodes of right hand and right lower extremity weakness; subsided in about 2 weeks time.  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  She is allergic to contrast [gadolinium administered for her MRI].  MRI of the brain had shown multifocal white matter lesions compatible  with MS; mostly in the supratentorial white matter.  C-spine MRI had shown a single area of hyperintensity at around C2-C3.  Had lumbar puncture for CSF studies at the time of initial presentation which had shown about 8 oligoclonal bands.  Her last MRI of the brain was from September 2019.  Patient does not have any follow-ups.  Previously, dimethyl fumarate was recommended; she did not take it.  No follow-up with neurology since 2019.  She used to live in [place] at the time of  diagnosis.  She does not have any significant deficits currently.  Has numbness and tingling sensation over her hand this bilaterally mostly involving fingers 4 and 5.  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  Had C-spine MRI from March 2024; findings are the same as before.  Will repeat her MRI of the brain.  Will get MRI of the thoracic spine.  Will get hepatitis B surface antigen and core antibody; will check her JC virus antibody.  Follow-up in 3 months time.    I spent 60 minutes with the patient in face-to-face consultation, with 40 minutes spent in counseling and coordination of care as described above.     PLEASE NOTE:   This document has been produced using voice recognition software. Unrecognized errors in transcription may be present.

## 2022-09-10 ENCOUNTER — Inpatient Hospital Stay: Admit: 2022-09-10 | Payer: MEDICAID | Primary: Student in an Organized Health Care Education/Training Program

## 2022-09-10 DIAGNOSIS — M542 Cervicalgia: Secondary | ICD-10-CM

## 2022-09-10 NOTE — Progress Notes (Signed)
PHYSICAL / OCCUPATIONAL THERAPY - DAILY TREATMENT NOTE    Patient Name: Melissa Banks    Date: 09/10/2022    DOB: 1975-07-19  Insurance: [place] / Plan: [place] [employer]ERS [place]US / Product Type: *No Product type* /      Patient DOB verified Yes     Visit #   Current / Total 2 10   Time   In / Out [id number]   Pain   In / Out 3 5   Subjective Functional Status/Changes: Patient states she had pain after her first visit of therapy and did not want to perform the home exercises due to fear of making symptoms worse.      TREATMENT AREA =  Neck pain [M54.2]     OBJECTIVE    Heat (UNBILLED):  location/position: prone; neck  .    Min Rationale   10 decrease pain to improve patient's ability to progress to PLOF and address remaining functional goals.     Skin assessment post-treatment:   Intact     Therapeutic Procedures:    97110 Therapeutic Exercise (timed):  increase ROM, strength, coordination, balance, and proprioception to improve patient's ability to progress to PLOF and address remaining functional goals.   Tx Min Billable or 1:1 Min   (if diff from Tx Min) Details:   15  See flow sheet as applicable     97112 Neuromuscular Re-Education (timed):  improve balance, coordination, kinesthetic sense, posture, core stability and proprioception to improve patient's ability to develop conscious control of individual muscles and awareness of position of extremities in order to progress to PLOF and address remaining functional goals.   Tx Min Billable or 1:1 Min   (if diff from Tx Min) Details:   14  See flow sheet as applicable     97530 Therapeutic Activity (timed):  use of dynamic activities replicating functional movements to increase ROM, strength, coordination, balance, and proprioception in order to improve patient's ability to progress to PLOF and address remaining functional goals.   Tx Min Billable or 1:1 Min   (if diff from Tx Min) Details:   10  See flow sheet as applicable       39  MC BC  Totals Reminder: bill using total billable min of TIMED therapeutic procedures (example: do not include dry needle or estim unattended, both untimed codes, in totals to left)  8-22 min = 1 unit; 23-37 min = 2 units; 38-52 min = 3 units; 53-67 min = 4 units; 68-82 min = 5 units   Total Total     [x]   Patient Education billed concurrently with other procedures   [x]  Review HEP    []  Progressed/Changed HEP, detail:    []  Other detail:       Objective Information/Functional Measures/Assessment    Initiated exercises per POC. Patient states not initiated HEP due to having increase in pain after eval. Educated patient that initatiation of an exercise program may cause soreness. Reviewed HEP for clarification of form.     Patient will continue to benefit from skilled PT / OT services to modify and progress therapeutic interventions, analyze and address functional mobility deficits, analyze and address ROM deficits, analyze and address strength deficits, analyze and address soft tissue restrictions, analyze and cue for proper movement patterns, analyze and modify for postural abnormalities, analyze and address imbalance/dizziness, and instruct in home and community integration to address functional deficits and attain remaining goals.    Progress  toward goals / Updated goals:  []   See Progress Note/Recertification    Short Term Goals: To be accomplished in 2 treatments   Pt will report compliance and independence to HEP to help the pt manage their pain and symptoms.  Status at last note/certification:  established  Current: not met: pt has not initiated.   Long Term Goals: To be accomplished in 10 treatments  Pt will improve her Neck Disability Index score to 20% or better to improve ability to perform ADLs.  Status at last note/certification: 34%   2.  Pt will report an improvement in at worst pain to 4/10 to improve ability to tolerate riding her motorcycle.  Status at last note/certification: 8/10 at worst  3.  Pt will  increase AROM c/s flex to 55 degs, EXT to 50 degs, right SB to 30 degs, left SB to 25 degs, right rotation to 55 degs, left rotation to 40 degs to improve ability to read and drive with less pain.  Status at last note/certification:  Active Movements:   Cervical AROM AROM Comments:pain, area   Forward flexion 46 degs Pain in the neck region   Extension 36 degs Pain in the neck region   SB right 21 degs Pain in the neck and right UT region   SB left  16 degs Pain in the neck region   Rotation right 42 degs Pain in the neck region   Rotation left 24 degs Pain in the neck region   4.  Pt will report being able to sleep through the night over the past 3 days without awakening secondary to pain/numbness to improve sleep quality.   Status at last note/certification: unable to sleep through the night due to pain/numbness in the B medial hands.      Next PN/ RC due 09/14/22  Auth due 10th visit or 09/22/22    PLAN  - Continue Plan of Care    Chales Abrahams, PTA    09/10/2022    5:46 PM    Future Appointments   Date Time Provider Department Center   09/11/2022 11:30 AM Andria Meuse, MD [facility] AMB   09/13/2022  5:00 PM Chales Abrahams, PTA [facility]   09/17/2022  5:40 PM Chales Abrahams, PTA [facility]   12/10/2022 11:40 AM Vernie Murders, MD [facility]

## 2022-09-11 ENCOUNTER — Ambulatory Visit
Payer: MEDICAID | Attending: Physical Medicine & Rehabilitation | Primary: Student in an Organized Health Care Education/Training Program

## 2022-09-11 NOTE — Progress Notes (Unsigned)
[facility]  [address]  [address]  [place], [place] [address]  Tel: [phone]  Fax: [phone]          PROGRESS NOTE      HISTORY OF PRESENT ILLNESS:  The patient is a 47 y.o. female and was seen today for follow up of localized neck pain and bilateral hand numbness (R=L). Previously seen for cervical DDD. She rates her pain 3-8/10. Patient comes into the office with c/o localized neck pain, x 5-10 years w/o injury, and bilateral hand numbness (R=L). Patient says her pain is progressive in nature. She denies change in bowel or bladder habits. She denies loss of balance, falls, or impairments manual dexterity. Her pain is exacerbated by laying down. She denies recent fevers, weight loss, rashes, or skin sores. She denies a hx of stomach ulcers or bleeding disorders. She denies a hx of history of spinal surgery. Patient had cervical injection in the past, x 5-10 years ago. She denies recent physical therapy or chiropractic care. She reports that to her knowledge her kidneys function properly, GFR of 66 on 05/22/2022. She has taken Flexeril without significant benefit. PmHx of DM (A1C of 7.8 on 05/22/2022), smoker, type 1 DM (followed by endocrinology, Dr. Ovidio Hanger, on insulin pump), MS (upcoming appointment with neurology on September 07, 2022). Note from Elberta Fortis, DO dated 05/22/2022 indicating patient was seen for neck pain and was referred to me. C spine MRI dated 05/28/2022 films were independently reviewed by me. Per report, Small focus of T2 signal abnormality in the cord at C2-3 which is nonspecific but could reflect demyelinating disease given patient's clinical history and optic neuritis. No abnormal enhancement. 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 At her last clinical appointment, I will start her on a Medrol Dosepak followed by Naproxen 500 mg BID with food for 2 weeks after the MDP after endocrinology (Dr. Ovidio Hanger) approval. I  will refer her to physical therapy with an emphasis on HEP. The patient is Neurologically intact. I will see the patient back in 2 months or earlier if needed.       The patient returns today with {painloc:63668::"***"} She rates her pain {Pain level:67855::"***"}/10, previously ***/10. Patient says that overall her pain is {overall:63823} when compared to the last office visit. ***    {HPI Common Phrases:66166}    PMP reviewed. There is no height or weight on file to calculate BMI.    PCP: Elberta Fortis, DO      Past Medical History:   Diagnosis Date    Anxiety     Diabetes mellitus (HCC)     Vitamin D deficiency        Social History     Socioeconomic History    Marital status: Single   Tobacco Use    Smoking status: Some Days     Current packs/day: 0.50     Average packs/day: 0.5 packs/day for 15.3 years (7.6 ttl pk-yrs)     Types: Cigarettes     Start date: 06/06/2007     Passive exposure: Current    Smokeless tobacco: Never   Vaping Use    Vaping Use: Never used   Substance and Sexual Activity    Alcohol use: Yes     Alcohol/week: 2.0 standard drinks of alcohol     Types: 2 Drinks containing 0.5 oz of alcohol per week    Drug use: Never       Current Outpatient Medications  Medication Sig Dispense Refill    NOVOLOG 100 UNIT/ML injection vial Inject into the skin      cyclobenzaprine (FLEXERIL) 5 MG tablet Take 1 tablet by mouth 2 times daily as needed for Muscle spasms      diazePAM (VALIUM) 2 MG tablet Take 1 tablet by mouth every 6 hours as needed for Anxiety.      vitamin D (ERGOCALCIFEROL) 1.25 MG (50000 UT) CAPS capsule Take 1 capsule by mouth once a week      BAQSIMI TWO PACK 3 MG/DOSE POWD INJECT 3 MILLIGRAM(S) NASAL EVERY DAY AS NEEDED FOR LOW BLOOD SUGAR       No current facility-administered medications for this visit.       Allergies   Allergen Reactions    Iodinated Contrast Media Swelling     Swelling and hives          PHYSICAL EXAMINATION    There were no vitals taken for this  visit.      {Strength:58873}      ASSESSMENT   There are no diagnoses linked to this encounter.      IMPRESSION AND PLAN:  Patient returns to the office today with c/o ***. Multiple treatment options were discussed. {Impression/plan:64810} *** {Neurologically Intact:67856::"The patient is Neurologically intact."} {followup:58872::"I will see the patient back *** or earlier if needed"}    Written by Stevan Born, medical scribe, as dictated by Laney Pastor, MD  I examined the patient, reviewed and agree with the note.

## 2022-09-13 ENCOUNTER — Inpatient Hospital Stay: Admit: 2022-09-13 | Payer: MEDICAID | Primary: Student in an Organized Health Care Education/Training Program

## 2022-09-13 NOTE — Other (Cosign Needed Addendum)
[facility]  [address], [place] [address] Ph: [phone] Fx: [phone]  PHYSICAL THERAPY PROGRESS NOTE  Patient Name: Melissa Banks DOB: [birth year]   Treatment/Medical Diagnosis: Neck pain [M54.2]   Referral Source: Andria Meuse, MD     Date of Initial Visit: 08/14/22 Attended Visits: 3 Missed Visits: 0     SUMMARY OF TREATMENT  Patient has attended two follow up appointments since initial evaluation on 08/14/22 due to pending insurance auth. Patient did not perform home exercise program after evaluation due to having increase in pain after initial evaluation. Patient continues to be awaken at night due to pain and has limited cervical AROM.        CURRENT STATUS    Short Term Goals: To be accomplished in 2 treatments   Pt will report compliance and independence to HEP to help the pt manage their pain and symptoms.  Status at last note/certification:  established  Current: not met: pt has initiated HEP.   Long Term Goals: To be accomplished in 10 treatments  Pt will improve her Neck Disability Index score to 20% or better to improve ability to perform ADLs.  Status at last note/certification: 34%   Current: progressing: NDI = 24%  2.  Pt will report an improvement in at worst pain to 4/10 to improve ability to tolerate riding her motorcycle.  Status at last note/certification: 8/10 at worst  Current: progressing: pain at worse 5/10. 09/13/22  3.  Pt will increase AROM c/s flex to 55 degs, EXT to 50 degs, right SB to 30 degs, left SB to 25 degs, right rotation to 55 degs, left rotation to 40 degs to improve ability to read and drive with less pain.  Status at last note/certification:  Active Movements:   Cervical AROM AROM Comments:pain, area   Forward flexion 46 degs Pain in the neck region   Extension 36 degs Pain in the neck region   SB right 21 degs Pain in the neck and right UT region   SB left  16 degs Pain in the neck region   Rotation right 42  degs Pain in the neck region   Rotation left 24 degs Pain in the neck region      Current:      Cervical AROM AROM Comments:pain, area   Forward flexion 40 degs Pain in the neck region   Extension 20 degs Pain in the neck region   SB right 25 degs Pain in the neck and right UT region   SB left  25 degs Pain in the neck region   Rotation right 23 degs Pain in the neck region   Rotation left 22 degs Pain in the neck region         4.  Pt will report being able to sleep through the night over the past 3 days without awakening secondary to pain/numbness to improve sleep quality.   Status at last note/certification: unable to sleep through the night due to pain/numbness in the B medial hands.    Current: not met; patient awaken 2x last night due to neck pain.          [place] / Plan[place] [employer] [employer] [place] / Product Type: *No Product type* /     Non-Medicare, can change goals, can adjust or add frequency duration, no signature required      New Goals to be achieved in __5__ [facility]  1.Pt will improve her Neck Disability Index score to 20% or better to improve ability to perform ADLs.  AT PN: progressing: NDI = 24%  2.  Pt will report an improvement in at worst pain to 4/10 to improve ability to tolerate riding her motorcycle.  AT PN: progressing: pain at worse 5/10. 09/13/22  3.  Pt will increase AROM c/s flex to 55 degs, EXT to 50 degs, right SB to 30 degs, left SB to 25 degs, right rotation to 55 degs, left rotation to 40 degs to improve ability to read and drive with less pain.  AT PN:      Cervical AROM AROM Comments:pain, area   Forward flexion 40 degs Pain in the neck region   Extension 20 degs Pain in the neck region   SB right 25 degs Pain in the neck and right UT region   SB left  25 degs Pain in the neck region   Rotation right 23 degs Pain in the neck region   Rotation left 22 degs Pain in the neck region         4.  Pt will report being able to sleep through the night over the  past 3 days without awakening secondary to pain/numbness to improve sleep quality.   AT PN: not met; patient awaken 2x last night due to neck pain.   5.Pt will report compliance and independence to HEP to help the pt manage their pain and symptoms.  AT PN:  not met: pt has initiated HEP.        Frequency / Duration:   Patient to be seen   2   times per week for   5    WEEKS    RECOMMENDATIONS  Patient would benefit from the continuation of skilled rehab interventions for functional progress to achieving above stated clinically significant goals.  Continue per initial Plan of Care.    If you have any questions/comments please contact us directly.  Thank you for allowing [place] to assist in the care of your patient.    ALEXIS C HARPER, PTA       09/13/2022       5:23 PM  Cosigned by Kari Baars, PT     09/14/2022     1:49 PM

## 2022-09-13 NOTE — Progress Notes (Signed)
PHYSICAL / OCCUPATIONAL THERAPY - DAILY TREATMENT NOTE    Patient Name: Melissa Banks    Date: 09/13/2022    DOB: 1975/12/31  Insurance: [place] / Plan: [employer] [employer] [place] / Product Type: *No Product type* /      Patient DOB verified Yes     Visit #   Current / Total 3 10   Time   In / Out [phone]   Pain   In / Out 3 5   Subjective Functional Status/Changes: Patient reports she has been doing her home exercises more.      TREATMENT AREA =  Neck pain [M54.2]     OBJECTIVE  Heat (UNBILLED):  location/position: prone; neck  .    Min Rationale   10 decrease pain to improve patient's ability to progress to PLOF and address remaining functional goals.     Skin assessment post-treatment:   Intact      Therapeutic Procedures:     97110 Therapeutic Exercise (timed):  increase ROM, strength, coordination, balance, and proprioception to improve patient's ability to progress to PLOF and address remaining functional goals.   Tx Min Billable or 1:1 Min   (if diff from Tx Min) Details:   15   See flow sheet as applicable      97112 Neuromuscular Re-Education (timed):  improve balance, coordination, kinesthetic sense, posture, core stability and proprioception to improve patient's ability to develop conscious control of individual muscles and awareness of position of extremities in order to progress to PLOF and address remaining functional goals.   Tx Min Billable or 1:1 Min   (if diff from Tx Min) Details:   15   See flow sheet as applicable      97530 Therapeutic Activity (timed):  use of dynamic activities replicating functional movements to increase ROM, strength, coordination, balance, and proprioception in order to improve patient's ability to progress to PLOF and address remaining functional goals.   Tx Min Billable or 1:1 Min   (if diff from Tx Min) Details:   14   See flow sheet as applicable         44   MC BC Totals Reminder: bill using total billable min of TIMED therapeutic procedures  (example: do not include dry needle or estim unattended, both untimed codes, in totals to left)  8-22 min = 1 unit; 23-37 min = 2 units; 38-52 min = 3 units; 53-67 min = 4 units; 68-82 min = 5 units   Total Total         [x]   Patient Education billed concurrently with other procedures   [x]  Review HEP    []  Progressed/Changed HEP, detail:    []  Other detail:       Objective Information/Functional Measures/Assessment    Patient has attended two follow up appointments since initial evaluation on 08/14/22 due to pending insurance auth. Patient did not perform home exercise program after evaluation due to having increase in pain after initial evaluation. Patient continues to be awaken at night due to pain and has limited cervical AROM.     Patient will continue to benefit from skilled PT / OT services to modify and progress therapeutic interventions, analyze and address functional mobility deficits, analyze and address ROM deficits, analyze and address strength deficits, analyze and address soft tissue restrictions, analyze and cue for proper movement patterns, analyze and modify for postural abnormalities, analyze and address imbalance/dizziness, and instruct in home and community integration to  address functional deficits and attain remaining goals.    Progress toward goals / Updated goals:  []   See Progress Note/Recertification    Short Term Goals: To be accomplished in 2 treatments   Pt will report compliance and independence to HEP to help the pt manage their pain and symptoms.  Status at last note/certification:  established  Current: not met: pt has initiated HEP.   Long Term Goals: To be accomplished in 10 treatments  Pt will improve her Neck Disability Index score to 20% or better to improve ability to perform ADLs.  Status at last note/certification: 34%   Current: progressing: NDI = 24%  2.  Pt will report an improvement in at worst pain to 4/10 to improve ability to tolerate riding her motorcycle.  Status at  last note/certification: 8/10 at worst  Current: progressing: pain at worse 5/10. 09/13/22  3.  Pt will increase AROM c/s flex to 55 degs, EXT to 50 degs, right SB to 30 degs, left SB to 25 degs, right rotation to 55 degs, left rotation to 40 degs to improve ability to read and drive with less pain.  Status at last note/certification:  Active Movements:   Cervical AROM AROM Comments:pain, area   Forward flexion 46 degs Pain in the neck region   Extension 36 degs Pain in the neck region   SB right 21 degs Pain in the neck and right UT region   SB left  16 degs Pain in the neck region   Rotation right 42 degs Pain in the neck region   Rotation left 24 degs Pain in the neck region     Current:     Cervical AROM AROM Comments:pain, area   Forward flexion 40 degs Pain in the neck region   Extension 20 degs Pain in the neck region   SB right 25 degs Pain in the neck and right UT region   SB left  25 degs Pain in the neck region   Rotation right 23 degs Pain in the neck region   Rotation left 22 degs Pain in the neck region       4.  Pt will report being able to sleep through the night over the past 3 days without awakening secondary to pain/numbness to improve sleep quality.   Status at last note/certification: unable to sleep through the night due to pain/numbness in the B medial hands.    Current: not met; patient awaken 2x last night due to neck pain.      Next PN/ RC due 09/14/22  Auth due 10th visit or 09/22/22       PLAN  - Continue Plan of Care    Chales Abrahams, PTA    09/13/2022    4:55 PM    Future Appointments   Date Time Provider Department Center   09/13/2022  5:00 PM Jamison Neighbor [facility]   09/17/2022  5:40 PM Chales Abrahams, PTA [facility]   12/10/2022 11:40 AM Vernie Murders, MD [facility]

## 2022-09-17 ENCOUNTER — Inpatient Hospital Stay: Admit: 2022-09-17 | Payer: MEDICAID | Primary: Student in an Organized Health Care Education/Training Program

## 2022-09-17 DIAGNOSIS — M542 Cervicalgia: Secondary | ICD-10-CM

## 2022-09-17 NOTE — Progress Notes (Signed)
PHYSICAL / OCCUPATIONAL THERAPY - DAILY TREATMENT NOTE    Patient Name: Melissa Banks    Date: 09/17/2022    DOB: 1975/08/21  Insurance: [place] / Plan: [place] [employer] [place] / Product Type: *No Product type* /      Patient DOB verified Yes     Visit #   Current / Total 1 10   Time   In / Out 537 616   Pain   In / Out 5 4   Subjective Functional Status/Changes: Patient states she is in more pain today due to from home exercises performed yesterday.      TREATMENT AREA =  Neck pain [M54.2]     OBJECTIVE      Therapeutic Procedures:      97110 Therapeutic Exercise (timed):  increase ROM, strength, coordination, balance, and proprioception to improve patient's ability to progress to PLOF and address remaining functional goals.   Tx Min Billable or 1:1 Min   (if diff from Tx Min) Details:   15   See flow sheet as applicable      97112 Neuromuscular Re-Education (timed):  improve balance, coordination, kinesthetic sense, posture, core stability and proprioception to improve patient's ability to develop conscious control of individual muscles and awareness of position of extremities in order to progress to PLOF and address remaining functional goals.   Tx Min Billable or 1:1 Min   (if diff from Tx Min) Details:   15   See flow sheet as applicable      97530 Therapeutic Activity (timed):  use of dynamic activities replicating functional movements to increase ROM, strength, coordination, balance, and proprioception in order to improve patient's ability to progress to PLOF and address remaining functional goals.   Tx Min Billable or 1:1 Min   (if diff from Tx Min) Details:   9   See flow sheet as applicable         39   MC BC Totals Reminder: bill using total billable min of TIMED therapeutic procedures (example: do not include dry needle or estim unattended, both untimed codes, in totals to left)  8-22 min = 1 unit; 23-37 min = 2 units; 38-52 min = 3 units; 53-67 min = 4 units; 68-82 min = 5  units   Total Total          [x]   Patient Education billed concurrently with other procedures   [x]  Review HEP    []  Progressed/Changed HEP, detail:    []  Other detail:       Objective Information/Functional Measures/Assessment    Frequent cues to to avoid increase in pain with exercises. Patient continues to be guarded with cervical rotation mobility. Initiated UT and Levator scap stretches to help improve in cervical mobility. Patient reports increase in radicular symptoms along right UE with prone W.     Patient will continue to benefit from skilled PT / OT services to modify and progress therapeutic interventions, analyze and address functional mobility deficits, analyze and address ROM deficits, analyze and address strength deficits, analyze and address soft tissue restrictions, analyze and cue for proper movement patterns, analyze and modify for postural abnormalities, analyze and address imbalance/dizziness, and instruct in home and community integration to address functional deficits and attain remaining goals.    Progress toward goals / Updated goals:  []   See Progress Note/Recertification    1.Pt will improve her Neck Disability Index score to 20% or better to improve ability to perform  ADLs.  AT PN: progressing: NDI = 24%  2.  Pt will report an improvement in at worst pain to 4/10 to improve ability to tolerate riding her motorcycle.  AT PN: progressing: pain at worse 5/10. 09/13/22  3.  Pt will increase AROM c/s flex to 55 degs, EXT to 50 degs, right SB to 30 degs, left SB to 25 degs, right rotation to 55 degs, left rotation to 40 degs to improve ability to read and drive with less pain.  AT PN:      Cervical AROM AROM Comments:pain, area   Forward flexion 40 degs Pain in the neck region   Extension 20 degs Pain in the neck region   SB right 25 degs Pain in the neck and right UT region   SB left  25 degs Pain in the neck region   Rotation right 23 degs Pain in the neck region   Rotation left 22 degs Pain  in the neck region         4.  Pt will report being able to sleep through the night over the past 3 days without awakening secondary to pain/numbness to improve sleep quality.   AT PN: not met; patient awaken 2x last night due to neck pain.   5.Pt will report compliance and independence to HEP to help the pt manage their pain and symptoms.  AT PN:  not met: pt has initiated HEP.     /Next PN/ RC due 10/13/22  /Auth due 09/22/22 or 10th visit.     PLAN  - Continue Plan of Care    Chales Abrahams, PTA    09/17/2022    5:47 PM    Future Appointments   Date Time Provider Department Center   09/24/2022  5:40 PM Particia Jasper, [place] [facility]   09/27/2022  5:40 PM Particia Jasper, PTA [facility]   10/01/2022  5:40 PM Chales Abrahams, PTA [facility]   10/04/2022  3:40 PM Chales Abrahams, PTA [facility]   10/08/2022  4:20 PM Particia Jasper, [place] [facility]   10/11/2022  3:40 PM Particia Jasper, PTA [facility]   10/15/2022  4:20 PM Particia Jasper, PTA [facility]   10/18/2022  3:40 PM Chales Abrahams, PTA [facility]   12/10/2022 11:40 AM Vernie Murders, MD [facility]

## 2022-09-24 ENCOUNTER — Inpatient Hospital Stay: Admit: 2022-09-24 | Payer: MEDICAID | Primary: Student in an Organized Health Care Education/Training Program

## 2022-09-24 NOTE — Progress Notes (Signed)
PHYSICAL / OCCUPATIONAL THERAPY - DAILY TREATMENT NOTE    Patient Name: Melissa Banks    Date: 09/24/2022    DOB: 1975/08/10  Insurance: [place] / Plan: [place] [place] / Product Type: *No Product type* /      Patient DOB verified Yes     Visit #   Current / Total 2 10   Time   In / Out [phone]   Pain   In / Out 1 1   Subjective Functional Status/Changes: "When I do the exercises at home, they don't hurt now." Patient reports completion of HEP "every other day".      TREATMENT AREA =  Neck pain [M54.2]     OBJECTIVE  Modality rationale: decrease pain and increase tissue extensibility to improve the patient's ability to tolerate ADLs.    Min Type Additional Details    []  Estim:  [] Unatt       [] IFC  [] Premod                        [] Other:  [] w/ice   [] w/heat  Position:  Location:    []  Estim: [] Att    [] TENS instruct  [] NMES                    [] Other:  [] w/US   [] w/ice   [] w/heat  Position:  Location:    []   Traction: []  Cervical       [] Lumbar                       []  Prone          [] Supine                       [] Intermittent   [] Continuous Lbs:  []  before manual  []  after manual    []   Ultrasound: [] Continuous   []  Pulsed                           []   [] Location:  W/cm2:    []   Iontophoresis with dexamethasone         Location: []  Take home patch   []  In clinic   10 []   Ice     [x]   heat  []   Ice massage  []   Laser   []   Anodyne Position:prone with towel roll at forehead for C/S support  Location:C/S    []   Laser with stim  []   Other: Position:  Location:    []   Vasopneumatic Device  Pre-treatment girth:  Post-treatment girth:  Measured at (location):  Pressure:       []  lo []  med []  hi   Temperature: []  lo []  med []  hi   [x]  Skin assessment post-treatment:  [x] intact [x] redness- no adverse reaction    [] redness - adverse reaction:      Therapeutic Procedures:      97110 Therapeutic Exercise (timed):  increase ROM, strength, coordination, balance, and  proprioception to improve patient's ability to progress to PLOF and address remaining functional goals.   Tx Min Billable or 1:1 Min   (if diff from Tx Min) Details:   15 15  See flow sheet as applicable      97112 Neuromuscular Re-Education (timed):  improve balance, coordination, kinesthetic sense, posture, core stability and proprioception to improve patient's  ability to develop conscious control of individual muscles and awareness of position of extremities in order to progress to PLOF and address remaining functional goals.   Tx Min Billable or 1:1 Min   (if diff from Tx Min) Details:   15 15  See flow sheet as applicable      97530 Therapeutic Activity (timed):  use of dynamic activities replicating functional movements to increase ROM, strength, coordination, balance, and proprioception in order to improve patient's ability to progress to PLOF and address remaining functional goals.   Tx Min Billable or 1:1 Min   (if diff from Tx Min) Details:   11  11 See flow sheet as applicable         41 41  MC BC Totals Reminder: bill using total billable min of TIMED therapeutic procedures (example: do not include dry needle or estim unattended, both untimed codes, in totals to left)  8-22 min = 1 unit; 23-37 min = 2 units; 38-52 min = 3 units; 53-67 min = 4 units; 68-82 min = 5 units   Total Total          [x]   Patient Education billed concurrently with other procedures   [x]  Review HEP    []  Progressed/Changed HEP, detail:    []  Other detail:       Objective Information/Functional Measures/Assessment    Patient continues to report sleep disruptions but presents with report of 1/10 pain today. Patient reported improved stretch of UT/LS post cueing to grip plinth for increased scapular stabilization. Patient educated on use of hand towel for increased C/S support during supine positioning as well as avoiding prone positioning while sleeping for improved rest/recovery and improved therapeutic outcomes; she verbalized  understanding.     Patient will continue to benefit from skilled PT / OT services to modify and progress therapeutic interventions, analyze and address functional mobility deficits, analyze and address ROM deficits, analyze and address strength deficits, analyze and address soft tissue restrictions, analyze and cue for proper movement patterns, analyze and modify for postural abnormalities, analyze and address imbalance/dizziness, and instruct in home and community integration to address functional deficits and attain remaining goals.    Progress toward goals / Updated goals:  []   See Progress Note/Recertification    1.Pt will improve her Neck Disability Index score to 20% or better to improve ability to perform ADLs.  AT PN: progressing: NDI = 24%    2.  Pt will report an improvement in at worst pain to 4/10 to improve ability to tolerate riding her motorcycle.  AT PN: progressing: pain at worse 5/10. 09/13/22   Current: presents with report of 1/10 pain today 09/24/22    3.  Pt will increase AROM c/s flex to 55 degs, EXT to 50 degs, right SB to 30 degs, left SB to 25 degs, right rotation to 55 degs, left rotation to 40 degs to improve ability to read and drive with less pain.  AT PN:      Cervical AROM AROM Comments:pain, area   Forward flexion 40 degs Pain in the neck region   Extension 20 degs Pain in the neck region   SB right 25 degs Pain in the neck and right UT region   SB left  25 degs Pain in the neck region   Rotation right 23 degs Pain in the neck region   Rotation left 22 degs Pain in the neck region    Current:      4.  Pt will report being able to sleep through the night over the past 3 days without awakening secondary to pain/numbness to improve sleep quality.   AT PN: not met; patient awaken 2x last night due to neck pain.   Current:     5.Pt will report compliance and independence to HEP to help the pt manage their pain and symptoms.  AT PN:  not met: pt has initiated HEP.   Current: patient reports  completion of HEP "every other day" 09/24/22    /Next PN/ RC due 10/13/22  /Auth due 10/22/22 or 10th visit.     PLAN  - Continue Plan of Care    Particia Jasper, [place]    09/24/2022    10:52 AM    Future Appointments   Date Time Provider Department Center   09/24/2022  5:40 PM Particia Jasper, [place] [facility]   09/27/2022  5:40 PM Particia Jasper, PTA [facility]   10/01/2022  5:40 PM Chales Abrahams, PTA [facility]   10/04/2022  3:40 PM Chales Abrahams, PTA [facility]   10/08/2022  4:20 PM Particia Jasper,  [facility]   10/11/2022  3:40 PM Particia Jasper, PTA [facility]   10/15/2022  4:20 PM Particia Jasper, PTA [facility]   10/18/2022  3:40 PM Chales Abrahams, PTA [facility]   12/10/2022 11:40 AM Vernie Murders, MD [facility]

## 2022-09-27 ENCOUNTER — Inpatient Hospital Stay: Admit: 2022-09-27 | Payer: MEDICAID | Primary: Student in an Organized Health Care Education/Training Program

## 2022-09-27 NOTE — Progress Notes (Signed)
PHYSICAL / OCCUPATIONAL THERAPY - DAILY TREATMENT NOTE    Patient Name: Melissa Banks    Date: 09/27/2022    DOB: 1975-12-12  Insurance: [place] / [place]: [place] [employer]ERS [place] / Product Type: *No Product type* /      Patient DOB verified Yes     Visit #   Current / Total 3 10   Time   In / Out 542 630   Pain   In / Out 0 neck 0   Subjective Functional Status/Changes: Patient denies neck pain, only reporting ankle discomfort after an incident on the playground at work yesterday.      TREATMENT AREA =  Neck pain [M54.2]     OBJECTIVE  Modality rationale: decrease pain and increase tissue extensibility to improve the patient's ability to tolerate ADLs.    Min Type Additional Details    []  Estim:  [] Unatt       [] IFC  [] Premod                        [] Other:  [] w/ice   [] w/heat  Position:  Location:    []  Estim: [] Att    [] TENS instruct  [] NMES                    [] Other:  [] w/US   [] w/ice   [] w/heat  Position:  Location:    []   Traction: []  Cervical       [] Lumbar                       []  Prone          [] Supine                       [] Intermittent   [] Continuous Lbs:  []  before manual  []  after manual    []   Ultrasound: [] Continuous   []  Pulsed                           []   [] Location:  W/cm2:    []   Iontophoresis with dexamethasone         Location: []  Take home patch   []  In clinic   10 []   Ice     [x]   heat  []   Ice massage  []   Laser   []   Anodyne Position:prone with towel roll at forehead for C/S support  Location:C/S    []   Laser with stim  []   Other: Position:  Location:    []   Vasopneumatic Device  Pre-treatment girth:  Post-treatment girth:  Measured at (location):  Pressure:       []  lo []  med []  hi   Temperature: []  lo []  med []  hi   [x]  Skin assessment post-treatment:  [x] intact [x] redness- no adverse reaction    [] redness - adverse reaction:      Therapeutic Procedures:  97110 Therapeutic Exercise (timed):  increase ROM, strength, coordination, balance, and  proprioception to improve patient's ability to progress to PLOF and address remaining functional goals.   Tx Min Billable or 1:1 Min   (if diff from Tx Min) Details:   14 14 See flow sheet as applicable     97112 Neuromuscular Re-Education (timed):  improve balance, coordination, kinesthetic sense, posture, core stability and proprioception to improve patient's ability to develop conscious control of individual  muscles and awareness of position of extremities in order to progress to PLOF and address remaining functional goals.   Tx Min Billable or 1:1 Min   (if diff from Tx Min) Details:   14 14 See flow sheet as applicable     97530 Therapeutic Activity (timed):  use of dynamic activities replicating functional movements to increase ROM, strength, coordination, balance, and proprioception in order to improve patient's ability to progress to PLOF and address remaining functional goals.   Tx Min Billable or 1:1 Min   (if diff from Tx Min) Details:   10 10 See flow sheet as applicable      38 38 MC BC Totals Reminder: bill using total billable min of TIMED therapeutic procedures (example: do not include dry needle or estim unattended, both untimed codes, in totals to left)  8-22 min = 1 unit; 23-37 min = 2 units; 38-52 min = 3 units; 53-67 min = 4 units; 68-82 min = 5 units   Total Total          [x]   Patient Education billed concurrently with other procedures   [x]  Review HEP    []  Progressed/Changed HEP, detail:    []  Other detail:       Objective Information/Functional Measures/Assessment    Patient presents to clinic with denial of neck pain for first time since [facility], possibly 2/2 avoiding prone positioning while sleeping last night. Good tolerance exhibited to initiation of wall slides and wall angels for improved functional reaching; patient without report of adverse response/radicular sx. Cueing focused on abolishing C/S lateral flexion iso hold during modified open books for improved comfort during therex  completion. Plan to initiate gentle C SNAGs at NV for increased C/S rotation ROM.     Patient will continue to benefit from skilled PT / OT services to modify and progress therapeutic interventions, analyze and address functional mobility deficits, analyze and address ROM deficits, analyze and address strength deficits, analyze and address soft tissue restrictions, analyze and cue for proper movement patterns, analyze and modify for postural abnormalities, analyze and address imbalance/dizziness, and instruct in home and community integration to address functional deficits and attain remaining goals.    Progress toward goals / Updated goals:  []   See Progress Note/Recertification    1.Pt will improve her Neck Disability Index score to 20% or better to improve ability to perform ADLs.  AT PN: progressing: NDI = 24%    2.  Pt will report an improvement in at worst pain to 4/10 to improve ability to tolerate riding her motorcycle.  AT PN: progressing: pain at worse 5/10. 09/13/22   Current: progressing, denies pain upon presentation 09/27/22    3.  Pt will increase AROM c/s flex to 55 degs, EXT to 50 degs, right SB to 30 degs, left SB to 25 degs, right rotation to 55 degs, left rotation to 40 degs to improve ability to read and drive with less pain.  AT PN:      Cervical AROM AROM Comments:pain, area   Forward flexion 40 degs Pain in the neck region   Extension 20 degs Pain in the neck region   SB right 25 degs Pain in the neck and right UT region   SB left  25 degs Pain in the neck region   Rotation right 23 degs Pain in the neck region   Rotation left 22 degs Pain in the neck region    Current:      4.  Pt will report being able to sleep through the night over the past 3 days without awakening secondary to pain/numbness to improve sleep quality.   AT PN: not met; patient awaken 2x last night due to neck pain.   Current: no change, patient continues to report waking frequently 2/2 pain 09/27/22    5.Pt will report  compliance and independence to HEP to help the pt manage their pain and symptoms.  AT PN:  not met: pt has initiated HEP.   Current: patient reports completion of HEP "every other day" 09/24/22    /Next PN/ RC due 10/13/22  /Auth due 10/22/22 or 10th visit.     PLAN  - Continue Plan of Care    Particia Jasper, [place]    09/27/2022    10:07 AM    Future Appointments   Date Time Provider Department Center   09/27/2022  5:40 PM Particia Jasper, [place] [facility]   10/01/2022  5:40 PM Chales Abrahams, PTA [facility]   10/04/2022  3:40 PM Chales Abrahams, PTA [facility]   10/08/2022  4:20 PM Particia Jasper, [place] [facility]   10/11/2022  3:40 PM Particia Jasper, PTA [facility]   10/15/2022  4:20 PM Particia Jasper, P[place] [facility]   10/18/2022  3:40 PM Chales Abrahams, PTA [facility]   12/10/2022 11:40 AM Vernie Murders, MD [facility]

## 2022-10-01 ENCOUNTER — Inpatient Hospital Stay: Admit: 2022-10-01 | Payer: MEDICAID | Primary: Student in an Organized Health Care Education/Training Program

## 2022-10-01 NOTE — Progress Notes (Signed)
PHYSICAL / OCCUPATIONAL THERAPY - DAILY TREATMENT NOTE    Patient Name: Melissa Banks    Date: 10/01/2022    DOB: December 09, 1975  Insurance: [place] / Plan: AN[place]M [place] [employer]KEEPERS [place] / Product Type: *No Product type* /      Patient DOB verified Yes     Visit #   Current / Total 4 10   Time   In / Out 535 630   Pain   In / Out 0 0   Subjective Functional Status/Changes: Patient states she has noticed less pain with turning to check her blind spot when driving her motorcycle and when looking down while reading.      TREATMENT AREA =  Neck pain [M54.2]     OBJECTIVE    Heat (UNBILLED):  location/position: prone; neck .    Min Rationale   10 decrease pain to improve patient's ability to progress to PLOF and address remaining functional goals.     Skin assessment post-treatment:   Intact     Therapeutic Procedures:    97110 Therapeutic Exercise (timed):  increase ROM, strength, coordination, balance, and proprioception to improve patient's ability to progress to PLOF and address remaining functional goals.   Tx Min Billable or 1:1 Min   (if diff from Tx Min) Details:   20   See flow sheet as applicable      97112 Neuromuscular Re-Education (timed):  improve balance, coordination, kinesthetic sense, posture, core stability and proprioception to improve patient's ability to develop conscious control of individual muscles and awareness of position of extremities in order to progress to PLOF and address remaining functional goals.   Tx Min Billable or 1:1 Min   (if diff from Tx Min) Details:   15   See flow sheet as applicable      97530 Therapeutic Activity (timed):  use of dynamic activities replicating functional movements to increase ROM, strength, coordination, balance, and proprioception in order to improve patient's ability to progress to PLOF and address remaining functional goals.   Tx Min Billable or 1:1 Min   (if diff from Tx Min) Details:   10   See flow sheet as applicable         45   MC  BC Totals Reminder: bill using total billable min of TIMED therapeutic procedures (example: do not include dry needle or estim unattended, both untimed codes, in totals to left)  8-22 min = 1 unit; 23-37 min = 2 units; 38-52 min = 3 units; 53-67 min = 4 units; 68-82 min = 5 units   Total Total          [x]   Patient Education billed concurrently with other procedures   [x]  Review HEP    []  Progressed/Changed HEP, detail:    []  Other detail:       Objective Information/Functional Measures/Assessment    Patient able to perform cervical rotation with use of towel, when previously not able to tolerate. Continues to be limited/guarded with AROM cervical rotation.     Patient will continue to benefit from skilled PT / OT services to modify and progress therapeutic interventions, analyze and address functional mobility deficits, analyze and address ROM deficits, analyze and address strength deficits, analyze and address soft tissue restrictions, analyze and cue for proper movement patterns, analyze and modify for postural abnormalities, analyze and address imbalance/dizziness, and instruct in home and community integration to address functional deficits and attain remaining goals.    Progress toward  goals / Updated goals:  []   See Progress Note/Recertification      1.Pt will improve her Neck Disability Index score to 20% or better to improve ability to perform ADLs.  AT PN: progressing: NDI = 24%     2.  Pt will report an improvement in at worst pain to 4/10 to improve ability to tolerate riding her motorcycle.  AT PN: progressing: pain at worse 5/10. 09/13/22   Current: MET: 3/10 pain at worse. 10/01/22     3.  Pt will increase AROM c/s flex to 55 degs, EXT to 50 degs, right SB to 30 degs, left SB to 25 degs, right rotation to 55 degs, left rotation to 40 degs to improve ability to read and drive with less pain.  AT PN:      Cervical AROM AROM Comments:pain, area   Forward flexion 40 degs Pain in the neck region   Extension  20 degs Pain in the neck region   SB right 25 degs Pain in the neck and right UT region   SB left  25 degs Pain in the neck region   Rotation right 23 degs Pain in the neck region   Rotation left 22 degs Pain in the neck region    Current:      4.  Pt will report being able to sleep through the night over the past 3 days without awakening secondary to pain/numbness to improve sleep quality.   AT PN: not met; patient awaken 2x last night due to neck pain.   Current: no change, patient continues to report waking frequently 2/2 pain 09/27/22     5.Pt will report compliance and independence to HEP to help the pt manage their pain and symptoms.  AT PN:  not met: pt has initiated HEP.   Current: patient reports completion of HEP "every other day" 09/24/22     /Next PN/ RC due 10/13/22  /Auth due 10/22/22 or 10th visit.        PLAN  - Continue Plan of Care    Chales Abrahams, PTA    10/01/2022    5:43 PM    Future Appointments   Date Time Provider Department Center   10/04/2022  3:40 PM Chales Abrahams, [place] [facility]   10/08/2022  4:20 PM Particia Jasper, [place] [facility]   10/11/2022  3:40 PM Particia Jasper, PTA [facility]   10/15/2022  4:20 PM Particia Jasper, PTA [facility]   10/18/2022  3:40 PM Chales Abrahams, PTA [facility]   12/10/2022 11:40 AM Vernie Murders, MD [facility] BS [facility]

## 2022-10-04 ENCOUNTER — Inpatient Hospital Stay: Admit: 2022-10-04 | Payer: MEDICAID | Primary: Student in an Organized Health Care Education/Training Program

## 2022-10-04 NOTE — Progress Notes (Signed)
PHYSICAL / OCCUPATIONAL THERAPY - DAILY TREATMENT NOTE    Patient Name: Melissa Banks    Date: 10/04/2022    DOB: August 26, 1975  Insurance: [place] / Plan: AN[place]M [place] [employer]KEEPERS [place] / Product Type: *No Product type* /      Patient DOB verified Yes     Visit #   Current / Total 5 10   Time   In / Out 342 432   Pain   In / Out 0 2   Subjective Functional Status/Changes: Patient states she continues to have to turn her whole body when trying to look at something on her to her side, but has noticed improvement in the motion.      TREATMENT AREA =  Neck pain [M54.2]     OBJECTIVE      Heat (UNBILLED):  location/position: prone; neck .    Min Rationale   10 decrease pain to improve patient's ability to progress to PLOF and address remaining functional goals.     Skin assessment post-treatment:   Intact      Therapeutic Procedures:     97110 Therapeutic Exercise (timed):  increase ROM, strength, coordination, balance, and proprioception to improve patient's ability to progress to PLOF and address remaining functional goals.   Tx Min Billable or 1:1 Min   (if diff from Tx Min) Details:   20   See flow sheet as applicable      97112 Neuromuscular Re-Education (timed):  improve balance, coordination, kinesthetic sense, posture, core stability and proprioception to improve patient's ability to develop conscious control of individual muscles and awareness of position of extremities in order to progress to PLOF and address remaining functional goals.   Tx Min Billable or 1:1 Min   (if diff from Tx Min) Details:   10   See flow sheet as applicable      97530 Therapeutic Activity (timed):  use of dynamic activities replicating functional movements to increase ROM, strength, coordination, balance, and proprioception in order to improve patient's ability to progress to PLOF and address remaining functional goals.   Tx Min Billable or 1:1 Min   (if diff from Tx Min) Details:   10   See flow sheet as  applicable         40  MC BC Totals Reminder: bill using total billable min of TIMED therapeutic procedures (example: do not include dry needle or estim unattended, both untimed codes, in totals to left)  8-22 min = 1 unit; 23-37 min = 2 units; 38-52 min = 3 units; 53-67 min = 4 units; 68-82 min = 5 units   Total Total          [x]   Patient Education billed concurrently with other procedures   [x]  Review HEP    []  Progressed/Changed HEP, detail:    []  Other detail:       Objective Information/Functional Measures/Assessment    Patient continues to have limited AROM of cervical mobility. Patient states performing HEP every other day, encouraged patient to perform next stretches multiple times a day due to having limited cervical mobility to help improve ROM.     Patient will continue to benefit from skilled PT / OT services to modify and progress therapeutic interventions, analyze and address functional mobility deficits, analyze and address ROM deficits, analyze and address strength deficits, analyze and address soft tissue restrictions, analyze and cue for proper movement patterns, analyze and modify for postural abnormalities, analyze and address imbalance/dizziness,  and instruct in home and community integration to address functional deficits and attain remaining goals.    Progress toward goals / Updated goals:  []   See Progress Note/Recertification      1.Pt will improve her Neck Disability Index score to 20% or better to improve ability to perform ADLs.  AT PN: progressing: NDI = 24%     2.  Pt will report an improvement in at worst pain to 4/10 to improve ability to tolerate riding her motorcycle.  AT PN: progressing: pain at worse 5/10. 09/13/22   Current: MET: 3/10 pain at worse. 10/01/22     3.  Pt will increase AROM c/s flex to 55 degs, EXT to 50 degs, right SB to 30 degs, left SB to 25 degs, right rotation to 55 degs, left rotation to 40 degs to improve ability to read and drive with less pain.  AT PN:       Cervical AROM AROM Comments:pain, area   Forward flexion 40 degs Pain in the neck region   Extension 20 degs Pain in the neck region   SB right 25 degs Pain in the neck and right UT region   SB left  25 degs Pain in the neck region   Rotation right 23 degs Pain in the neck region   Rotation left 22 degs Pain in the neck region    Current: regressed; 10/04/22     Cervical AROM AROM Comments:pain, area   Forward flexion 28 degs Pain in the neck region   Extension 15 degs Pain in the neck region   SB right 20 degs Pain in the neck and right UT region   SB left  35 degs Pain in the neck region   Rotation right 48 degs Pain in the neck region   Rotation left 50 degs Pain in the neck region     4.  Pt will report being able to sleep through the night over the past 3 days without awakening secondary to pain/numbness to improve sleep quality.   AT PN: not met; patient awaken 2x last night due to neck pain.   Current: no change, patient continues to report waking frequently 2/2 pain 09/27/22     5.Pt will report compliance and independence to HEP to help the pt manage their pain and symptoms.  AT PN:  not met: pt has initiated HEP.   Current: patient reports completion of HEP "every other day" 09/24/22       Next PN/ RC due 10/13/2022  Auth due 10th visit or 10/22/22    PLAN  - Continue Plan of Care    Jaline Pincock C [place] Mckimmy, PTA    10/04/2022    3:39 PM  If an interpreting service was utilized for treatment of this patient, the contents of this document represent the material reviewed with the patient via the interpreter.     Future Appointments   Date Time Provider Department Center   10/04/2022  3:40 PM Chales Abrahams, [place] [facility]   10/08/2022  4:20 PM Particia Jasper, [place] [facility]   10/11/2022  3:40 PM Particia Jasper, PTA [facility]   10/15/2022  4:20 PM Particia Jasper, [place] [facility]   10/18/2022  3:40 PM Chales Abrahams, PTA [facility]   12/10/2022 11:40 AM Vernie Murders, MD [facility]

## 2022-10-08 ENCOUNTER — Inpatient Hospital Stay: Admit: 2022-10-08 | Payer: MEDICAID | Primary: Student in an Organized Health Care Education/Training Program

## 2022-10-08 NOTE — Progress Notes (Signed)
PHYSICAL / OCCUPATIONAL THERAPY - DAILY TREATMENT NOTE    Patient Name: Melissa Banks    Date: 10/08/2022    DOB: 07/03/1975  Insurance: [place] / [place] [employer] [place] / Product Type: *No Product type* /      Patient DOB verified Yes     Visit #   Current / Total 6 10   Time   In / Out 420 508   Pain   In / Out 0 0   Subjective Functional Status/Changes: Patient states that she noticed for the first time today that she is able to rotate her neck more easily/further. Patient states that driving longer than an hour flares up her pain; she needs to drive to [place] at times which takes 3 hours. She states that it's been at least a week since she has experienced hand numbness.      TREATMENT AREA =  Neck pain [M54.2]     OBJECTIVE      Heat (UNBILLED):  location/position: prone; neck .    Min Rationale   10 decrease pain to improve patient's ability to progress to PLOF and address remaining functional goals.     Skin assessment post-treatment:   Intact      Therapeutic Procedures:     97110 Therapeutic Exercise (timed):  increase ROM, strength, coordination, balance, and proprioception to improve patient's ability to progress to PLOF and address remaining functional goals.   Tx Min Billable or 1:1 Min   (if diff from Tx Min) Details:   15 15  See flow sheet as applicable      97112 Neuromuscular Re-Education (timed):  improve balance, coordination, kinesthetic sense, posture, core stability and proprioception to improve patient's ability to develop conscious control of individual muscles and awareness of position of extremities in order to progress to PLOF and address remaining functional goals.   Tx Min Billable or 1:1 Min   (if diff from Tx Min) Details:   15 15  See flow sheet as applicable      97530 Therapeutic Activity (timed):  use of dynamic activities replicating functional movements to increase ROM, strength, coordination, balance, and proprioception in order to  improve patient's ability to progress to PLOF and address remaining functional goals.   Tx Min Billable or 1:1 Min   (if diff from Tx Min) Details:   8 8  See flow sheet as applicable         38 38 MC BC Totals Reminder: bill using total billable min of TIMED therapeutic procedures (example: do not include dry needle or estim unattended, both untimed codes, in totals to left)  8-22 min = 1 unit; 23-37 min = 2 units; 38-52 min = 3 units; 53-67 min = 4 units; 68-82 min = 5 units   Total Total          [x]   Patient Education billed concurrently with other procedures   [x]  Review HEP    []  Progressed/Changed HEP, detail:    []  Other detail:       Objective Information/Functional Measures/Assessment    Patient educated on use of towel roll for improved spinal alignment/decreased forward head posture during long drives; she verbalized understanding. Consider progressing UBE warmup to fwd/retro at NV for increased posterior scapular strength. PN due at NV.     Patient will continue to benefit from skilled PT / OT services to modify and progress therapeutic interventions, analyze and address functional mobility deficits, analyze  and address ROM deficits, analyze and address strength deficits, analyze and address soft tissue restrictions, analyze and cue for proper movement patterns, analyze and modify for postural abnormalities, analyze and address imbalance/dizziness, and instruct in home and community integration to address functional deficits and attain remaining goals.    Progress toward goals / Updated goals:  []   See Progress Note/Recertification      1.Pt will improve her Neck Disability Index score to 20% or better to improve ability to perform ADLs.  AT PN: progressing: NDI = 24%   Current: assess at NV    2.  Pt will report an improvement in at worst pain to 4/10 to improve ability to tolerate riding her motorcycle.  AT PN: progressing: pain at worse 5/10. 09/13/22   Current: MET: 3/10 pain at worse. 10/01/22     3.   Pt will increase AROM c/s flex to 55 degs, EXT to 50 degs, right SB to 30 degs, left SB to 25 degs, right rotation to 55 degs, left rotation to 40 degs to improve ability to read and drive with less pain.  AT PN:      Cervical AROM AROM Comments:pain, area   Forward flexion 40 degs Pain in the neck region   Extension 20 degs Pain in the neck region   SB right 25 degs Pain in the neck and right UT region   SB left  25 degs Pain in the neck region   Rotation right 23 degs Pain in the neck region   Rotation left 22 degs Pain in the neck region    Current: regressed; 10/04/22     Cervical AROM AROM Comments:pain, area   Forward flexion 28 degs Pain in the neck region   Extension 15 degs Pain in the neck region   SB right 20 degs Pain in the neck and right UT region   SB left  35 degs (met) Pain in the neck region   Rotation right 48 degs (progressing) Pain in the neck region   Rotation left 50 degs (met) Pain in the neck region     4.  Pt will report being able to sleep through the night over the past 3 days without awakening secondary to pain/numbness to improve sleep quality.   AT PN: not met; patient awaken 2x last night due to neck pain.   Current: progressing, patient reports waking in the night "maybe once" over the last few days 10/08/22     5.Pt will report compliance and independence to HEP to help the pt manage their pain and symptoms.  AT PN:  not met: pt has initiated HEP.   Current: patient reports completion of HEP "every other day" 09/24/22       Next PN/ RC due 10/13/2022  Auth due 10th visit or 10/22/22    PLAN  - Continue Plan of Care    Particia Jasper, PTA    10/08/2022    9:59 AM  If an interpreting service was utilized for treatment of this patient, the contents of this document represent the material reviewed with the patient via the interpreter.     Future Appointments   Date Time Provider Department Center   10/08/2022  4:20 PM Particia Jasper, [place] [facility]   10/11/2022  3:40 PM Particia Jasper, PTA  [facility]   10/15/2022  4:20 PM Particia Jasper, [place] [facility]   10/18/2022  3:40 PM Chales Abrahams, PTA [facility]   12/10/2022 11:40 AM Abran Duke,  [place] D, MD [facility]

## 2022-10-08 NOTE — Telephone Encounter (Signed)
Patient wishes to cancel appointment for Monday, 7/29.

## 2022-10-11 ENCOUNTER — Inpatient Hospital Stay: Admit: 2022-10-11 | Payer: MEDICAID | Primary: Student in an Organized Health Care Education/Training Program

## 2022-10-11 NOTE — Other (Cosign Needed)
[facility]  [address], [place] [address] Ph: [phone] Fx: [phone]  PHYSICAL THERAPY PROGRESS NOTE  Patient Name: Melissa Banks DOB: 07-22-1975   Treatment/Medical Diagnosis: Neck pain [M54.2]   Referral Source: Andria Meuse, MD     Date of Initial Visit: 08/14/22 Attended Visits: 10 Missed Visits: 0     SUMMARY OF TREATMENT  Melissa Banks presents for her 10th visit including IE with report of 75% improvement. After a delayed start to care, she has exhibited good progress with consistent attendance in clinic, reporting increased C/S rotation, decreased instances of B radicular hand numbness, and increased time periods without neck pain occurrence during C/S flexion. While she has experienced a recent flareup in max pain (5/10) over the weekend, she attributes this to a prolonged drive out of state and states that she had "expected this". Prior to this flareup,she reports that her max pain levels have declined to 2/10 and occur most significantly with C/S flexion/extension. 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 Additionally, she exhibits decreased dissociation of her neck and trunk when looking up. Melissa Banks would continue to benefit from skilled PT intervention 2x/week for 4 weeks to improve her C/S mobility for improved ease of work duty completion.     CURRENT STATUS  1.Pt will improve her Neck Disability Index score to 20% or better to improve ability to perform ADLs.  AT PN: progressing: NDI = 24%   Current: progressing, 22%      2.  Pt will report an improvement in at worst pain to 4/10 to improve ability to tolerate riding her motorcycle.  AT PN: progressing: pain at worse 5/10. 09/13/22   Current: MET: 5/10 post prolonged drive to [place] over the weekend; 2/10 max prior to this     3.  Pt will increase AROM  c/s flex to 55 degs, EXT to 50 degs, right SB to 30 degs, left SB to 25 degs, right rotation to 55 degs, left rotation to 40 degs to improve ability to read and drive with less pain.  AT PN:      Cervical AROM AROM Comments:pain, area   Forward flexion 40 degs Pain in the neck region   Extension 20 degs Pain in the neck region   SB right 25 degs Pain in the neck and right UT region   SB left  25 degs Pain in the neck region   Rotation right 23 degs Pain in the neck region   Rotation left 22 degs Pain in the neck region    Current: regressed; 10/04/22     Cervical AROM AROM Comments:pain, area   Forward flexion 28 degs Pain in the neck region   Extension 15 degs Pain in the neck region   SB right 20 degs Pain in the neck and right UT region   SB left  35 degs (met) Pain in the neck region   Rotation right 48 degs (progressing) Pain in the neck region   Rotation left 50 degs (met) Pain in the neck region      4.  Pt will report being able to sleep through the night over the past 3 days without awakening secondary to pain/numbness to improve sleep quality.   AT PN: not met; patient awaken 2x last night due to neck pain.  Current: progressing, patient reports waking in the night "maybe once" over the last few days 10/08/22     5.Pt will report compliance and independence to HEP to help the pt manage their pain and symptoms.  AT PN:  not met: pt has initiated HEP.   Current: MET, patient reports daily compliance       [place] / [place] HEALTHKEEPERS [place] / Product Type: *No Product type* /     Non-Medicare, can change goals, can adjust or add frequency duration, no signature required      New Goals to be achieved in __4__ WEEKS  1.Pt will improve her Neck Disability Index score to 20% or better to improve ability to perform ADLs.  PN:  22%      2.  Pt will increase AROM c/s flex to 55 degs, EXT to 50 degs, right SB to 30 degs, left SB to 25 degs, right rotation to 55 degs, left rotation  to 40 degs to improve ability to read and drive with less pain.  PN:      Cervical AROM AROM Comments:pain, area   Forward flexion 28 degs Pain in the neck region   Extension 15 degs Pain in the neck region   SB right 20 degs Pain in the neck and right UT region   SB left  35 degs (met) Pain in the neck region   Rotation right 48 degs (progressing) Pain in the neck region   Rotation left 50 degs (met) Pain in the neck region      3.  Patient will complete 10 chin tucks with lift with 50% ROM with 0-1/10 p! Reported for improved ease of reading lesson plans and riding motorcycle.   PN: Patient completes 10 chin tucks with lift with report of 2/10 max pain and approximately 15% AROM.         4. Patient will report no pain when looking down to write lesson plans for 10 minutes.   PN: Patient reports C/S flexion tolerance of 3-4 minutes      Frequency / Duration:   Patient to be seen   2   times per week for   4    WEEKS    RECOMMENDATIONS  Patient would benefit from the continuation of skilled rehab interventions for functional progress to achieving above stated clinically significant goals.  Continue per initial Plan of Care.    If you have any questions/comments please contact us directly.  Thank you for allowing [place] to assist in the care of your patient.    Particia Jasper, PTA       10/11/2022       4:16 PM

## 2022-10-11 NOTE — Discharge Instructions (Addendum)
[facility]  [address], [place] [address] Ph: [phone] Fx: [phone]  DISCHARGE SUMMARY  Patient Name: Melissa Banks DOB: May 21, 1975   Treatment/Medical Diagnosis: Neck pain [M54.2]   Referral Source: Andria Meuse, MD     Date of Initial Visit: 08/14/2022 Attended Visits: 10 Missed Visits: 0     SUMMARY OF TREATMENT  Discontinue from therapy at this time due to no communication from patient.     CURRENT STATUS  1.Pt will improve her Neck Disability Index score to 20% or better to improve ability to perform ADLs.  AT PN: progressing: NDI = 24%   Current: progressing, 22%      2.  Pt will report an improvement in at worst pain to 4/10 to improve ability to tolerate riding her motorcycle.  AT PN: progressing: pain at worse 5/10. 09/13/22   Current: MET: 5/10 post prolonged drive to [place] over the weekend; 2/10 max prior to this     3.  Pt will increase AROM c/s flex to 55 degs, EXT to 50 degs, right SB to 30 degs, left SB to 25 degs, right rotation to 55 degs, left rotation to 40 degs to improve ability to read and drive with less pain.  AT PN:      Cervical AROM AROM Comments:pain, area   Forward flexion 40 degs Pain in the neck region   Extension 20 degs Pain in the neck region   SB right 25 degs Pain in the neck and right UT region   SB left  25 degs Pain in the neck region   Rotation right 23 degs Pain in the neck region   Rotation left 22 degs Pain in the neck region    Current: regressed; 10/04/22     Cervical AROM AROM Comments:pain, area   Forward flexion 28 degs Pain in the neck region   Extension 15 degs Pain in the neck region   SB right 20 degs Pain in the neck and right UT region   SB left  35 degs (met) Pain in the neck region   Rotation right 48 degs (progressing) Pain in the neck region   Rotation left 50 degs (met) Pain in the neck region      4.  Pt will report being able to sleep through the night over the past 3 days without awakening  secondary to pain/numbness to improve sleep quality.   AT PN: not met; patient awaken 2x last night due to neck pain.   Current: progressing, patient reports waking in the night "maybe once" over the last few days 10/08/22     5.Pt will report compliance and independence to HEP to help the pt manage their pain and symptoms.  AT PN:  not met: pt has initiated HEP.   Current: MET, patient reports daily compliance          non-Medicare    RECOMMENDATIONS  Discontinue therapy due to lack of attendance or compliance.    If you have any questions/comments please contact us directly at [phone].   Thank you for allowing [place] to assist in the care of your patient.    ALEXIS Aron Baba, PTA       12/27/2022       10:37 AM  Sherrie Sport PT, DPT [facility]    Medicaid Ins, signature required for DC ** NOTE TO PHYSICIAN:  Your patient's insurance requires this discharge note be signed and returned **  ___ I have read the above report and request that my patient be discharged from therapy.     Physician's Signature:_________________________   DATE:_________   TIME:________                           Andria Meuse, MD    ** Signature, Date and Time must be completed for valid certification **  Please sign and fax to [facility]

## 2022-10-11 NOTE — Progress Notes (Signed)
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, increased time without neck pain occurence when looking down to read lesson plans, decreased intensity of pain when looking up, decreased frequency of B hand numbness (ulnar)  Functional Deficits: flexing neck to read lesson plans, decreased dissociation of neck and trunk when looking up   % improvement: 75%  Pain   Average: 1/10       Best: 0/10     Worst: 5/10 post prolonged drive to [place] over the weekend; 2/10 max prior to this  Patient Goal: "to be able to read my lesson plans with less pain and more mobility"  Patient reports C/S flexion tolerance of 3-4 minutes (when reading/writing lesson plans); requires 30 minutes to plan lessons.      TREATMENT AREA =  Neck pain [M54.2]     OBJECTIVE      Heat (UNBILLED):  location/position: prone; neck .    Min Rationale   10 decrease pain to improve patient's ability to progress to PLOF and address remaining functional goals.     Skin assessment post-treatment:   Intact      Therapeutic Procedures:     97535 Self Care/Home Management (timed):  improve patient knowledge and understanding of diagnosis/prognosis and physical therapy expectations, procedures and progression  to improve patient's ability to progress to PLOF and address remaining functional goals.  (see flow sheet as applicable)    Tx Min Billable or 1:1 Min   (if diff from Tx Min) Details:   8 8 See flow sheet as applicable      97112 Neuromuscular Re-Education (timed):  improve balance, coordination, kinesthetic sense, posture, core stability and  proprioception to improve patient's ability to develop conscious control of individual muscles and awareness of position of extremities in order to progress to PLOF and address remaining functional goals.   Tx Min Billable or 1:1 Min   (if diff from Tx Min) Details:   8 8 See flow sheet as applicable; HEP review/progression      97530 Therapeutic Activity (timed):  use of dynamic activities replicating functional movements to increase ROM, strength, coordination, balance, and proprioception in order to improve patient's ability to progress to PLOF and address remaining functional goals.   Tx Min Billable or 1:1 Min   (if diff from Tx Min) Details:   24 24 See flow sheet as applicable; functional assessments          40 40 MC BC Totals Reminder: bill using total billable min of TIMED therapeutic procedures (example: do not include dry needle or estim unattended, both untimed codes, in totals to left)  8-22 min = 1 unit; 23-37 min = 2 units; 38-52 min = 3 units; 53-67 min = 4 units; 68-82 min = 5 units   Total Total          [x]   Patient Education billed concurrently with other procedures   [x]  Review HEP    [  x] Progressed/Changed HEP, detail:  increased time under tension during therex  []  Other detail:       Objective Information/Functional Measures/Assessment  Patient completes 10 chin tucks with lift with report of 2/10 max pain and approximately 15% AROM.     Ms. Annunziata presents for her 10th visit including IE with report of 75% improvement. After a delayed start to care, she has exhibited good progress with consistent attendance in clinic, reporting increased C/S rotation, decreased instances of B radicular hand numbness, and increased time periods without neck pain occurrence during C/S flexion. While she has experienced a recent flareup in max pain (5/10) over the weekend, she attributes this to a prolonged drive out of state and states that she had "expected this". Prior to this flareup,she reports that her  max pain levels have declined to 2/10 and occur most significantly with C/S flexion/extension. 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 Additionally, she exhibits decreased dissociation of her neck and trunk when looking up. Ms. Chamberland would continue to benefit from skilled PT intervention 2x/week for 4 weeks to improve her C/S mobility for improved ease of work duty completion.     Patient will continue to benefit from skilled PT / OT services to modify and progress therapeutic interventions, analyze and address functional mobility deficits, analyze and address ROM deficits, analyze and address strength deficits, analyze and address soft tissue restrictions, analyze and cue for proper movement patterns, analyze and modify for postural abnormalities, analyze and address imbalance/dizziness, and instruct in home and community integration to address functional deficits and attain remaining goals.    Progress toward goals / Updated goals:  []   See Progress Note/Recertification      1.Pt will improve her Neck Disability Index score to 20% or better to improve ability to perform ADLs.  AT PN: progressing: NDI = 24%   Current: progressing, 22%     2.  Pt will report an improvement in at worst pain to 4/10 to improve ability to tolerate riding her motorcycle.  AT PN: progressing: pain at worse 5/10. 09/13/22   Current: MET: 5/10 post prolonged drive to [place] over the weekend; 2/10 max prior to this     3.  Pt will increase AROM c/s flex to 55 degs, EXT to 50 degs, right SB to 30 degs, left SB to 25 degs, right rotation to 55 degs, left rotation to 40 degs to improve ability to read and drive with less pain.  AT PN:      Cervical AROM AROM Comments:pain, area   Forward flexion 40 degs Pain in the neck region   Extension 20 degs Pain in the neck region   SB right 25 degs Pain in the neck and  right UT region   SB left  25 degs Pain in the neck region   Rotation right 23 degs Pain in the neck region   Rotation left 22 degs Pain in the neck region    Current: regressed; 10/04/22     Cervical AROM AROM Comments:pain, area   Forward flexion 28 degs Pain in the neck region   Extension 15 degs Pain in the neck region   SB right 20 degs Pain in the neck and right UT region   SB left  35 degs (met) Pain in the neck region   Rotation right 48 degs (progressing) Pain  in the neck region   Rotation left 50 degs (met) Pain in the neck region     4.  Pt will report being able to sleep through the night over the past 3 days without awakening secondary to pain/numbness to improve sleep quality.   AT PN: not met; patient awaken 2x last night due to neck pain.   Current: progressing, patient reports waking in the night "maybe once" over the last few days 10/08/22     5.Pt will report compliance and independence to HEP to help the pt manage their pain and symptoms.  AT PN:  not met: pt has initiated HEP.   Current: MET, patient reports daily compliance        Next PN/ RC due 10/13/2022  Auth due 10th visit or 10/22/22    PLAN  Continue POC    Particia Jasper, PTA    10/11/2022    10:02 AM  If an interpreting service was utilized for treatment of this patient, the contents of this document represent the material reviewed with the patient via the interpreter.     Future Appointments   Date Time Provider Department Center   10/11/2022  3:40 PM Particia Jasper, [place] [facility]   10/18/2022  3:40 PM Chales Abrahams, PTA [facility]   12/10/2022 11:40 AM Vernie Murders, MD [facility]

## 2022-10-15 ENCOUNTER — Encounter: Payer: MEDICAID | Primary: Student in an Organized Health Care Education/Training Program

## 2022-10-18 ENCOUNTER — Inpatient Hospital Stay: Payer: MEDICAID | Primary: Student in an Organized Health Care Education/Training Program

## 2022-10-18 NOTE — Telephone Encounter (Signed)
Still waiting on auth ext approval. Will call patient to schedule once it is received.

## 2022-11-02 NOTE — Telephone Encounter (Signed)
Called patient to schedule follow up visits. Patient's insurance approved 8 additional visits. No answer, LVM.

## 2022-11-16 NOTE — Telephone Encounter (Signed)
 Called patient to follow up with patient. Patient has not been seen since 7/25. Auth has expired, will need to obtain a new auth prior to scheduling any follow ups. No answer, mailbox is full.

## 2022-11-26 NOTE — Telephone Encounter (Signed)
 Patient has appointment coming up on 11/28/2022 will refill it then as appropriate

## 2022-11-30 ENCOUNTER — Inpatient Hospital Stay
Admit: 2022-11-30 | Payer: MEDICAID | Attending: Student in an Organized Health Care Education/Training Program | Primary: Student in an Organized Health Care Education/Training Program

## 2022-11-30 DIAGNOSIS — G35 Multiple sclerosis: Secondary | ICD-10-CM

## 2022-12-10 ENCOUNTER — Ambulatory Visit
Admit: 2022-12-10 | Discharge: 2022-12-10 | Payer: MEDICAID | Attending: Student in an Organized Health Care Education/Training Program | Primary: Student in an Organized Health Care Education/Training Program

## 2022-12-10 VITALS — BP 132/84 | HR 90 | Temp 97.50000°F | Resp 19 | Ht 64.0 in | Wt 121.6 lb

## 2022-12-10 DIAGNOSIS — G35 Multiple sclerosis: Secondary | ICD-10-CM

## 2022-12-10 NOTE — Progress Notes (Signed)
 Melissa Banks is a 47 y.o. female .presents for Follow-up (3 month fu, Multiple sclerosis/)    A 47 years old female patient here for follow-up of multiple sclerosis.  Last seen for her initial visit in June 2024.  MRIs of the brain and thoracic spine and labs were ordered.  MRI of the brain from September 13 had shown  small burden of T2 FLAIR hyperintense lesions consistent with reported history of multiple sclerosis. No abnormal restricted diffusion to indicate active demyelination.  MRI of the thoracic spine did not show spinal cord signal changes.  Labs were not done.  She does not have any new symptoms.  No ER visit for MS exacerbation.  Denied any problem walking.  Continues to have the numbness and tingling sensation over the medial hand and fingers 4 and 5.  Intermittent: Maybe about once a week.  No weakness of her extremities.  No problem with her balance.  No new changes in her vision.       From initial encounter:  A 47 years old female patient referred here for evaluation of multiple sclerosis. Her symptoms started in September 2018 with blurring of vision on the left side; black spots on the left side.  She also had pain over the left eye.  No diplopia.  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  Had a lumbar puncture which had shown CSF IgG index was high; positive oligoclonal bands.  MRI of the brain from 2018 was reported to be normal.  C-spine MRI from September 2018 had shown  small focus of T2 signal abnormality in the cord at C2-3 which is nonspecific with might represent demyelinating disease.  Dimethyl fumarate was recommended; she thought that it was for research and refused to take it.  Subsequently seen at [facility] in August 2019.  MRI of the brain done without contrast [she had allergic reaction to the previous contrast for the MRI; gadolinium]: : 5-10, predominantly  supratentorial FLAIR hyperintense lesions over the white matter and brainstem.  C-spine MRI from the  same time showed Single T2 hyperintense lesion at the level of C2-C3 within the  dorsal midline cervical cord.  She did not have any subsequent follow-up.  She mentions that the initial vision changes subsided in about 1 month time.  Few years before her left eye vision changes, she mentioned transient episodes of numbness and weakness over the right hand which subsided in 2 weeks time.  She mentioned another episode years ago where she had a transient numbness and weakness over the right foot which got better in about a week time.  There is no recurrence of her visual problem.  She currently complains of numbness in her hands bilaterally; mostly involving fingers 4 and 5 and medial hand.  No involvement of the arm.  No elbow pain.  Has neck pain for the past 10 years; nonradiating.  Does not have Lhermitte's-like phenomenon.  No worsening symptoms with heat exposure.  She does not have bladder or bowel dysfunction.  No problem with her balance; no unsteadiness.  Does not fall.  No fatigue.  Has insulin-dependent diabetes since age of 48.  She has insulin infusion.  She was seen at the spine center.  MRI of the cervical spine without contrast from March 2024 done at [facility] had shown small focus of increased T2/STIR signal within the posterior central aspect suggesting an area of demyelination.      Review of Systems   Constitutional:  Negative for chills, fever and unexpected weight change.   HENT:  Negative for hearing loss and tinnitus.    Eyes:  Negative for visual disturbance.   Respiratory:  Negative for cough and shortness of breath.    Cardiovascular:  Negative for chest pain and leg swelling.   Gastrointestinal:  Negative for nausea and vomiting.   Genitourinary:  Negative for dysuria, frequency and urgency.   Musculoskeletal:  Positive for neck pain. Negative for back pain.   Skin:  Negative for rash.   Neurological:  Positive for numbness and headaches (sometimes). Negative for dizziness,  tremors, seizures, syncope, facial asymmetry, speech difficulty and weakness.         Past Medical History:   Diagnosis Date    Anxiety     Diabetes mellitus (HCC)     Vitamin D deficiency         No past surgical history on file.     No family history on file.     Social History     Socioeconomic History    Marital status: Single     Spouse name: Not on file    Number of children: Not on file    Years of education: Not on file    Highest education level: Not on file   Occupational History    Not on file   Tobacco Use    Smoking status: Some Days     Current packs/day: 0.50     Average packs/day: 0.5 packs/day for 15.5 years (7.8 ttl pk-yrs)     Types: Cigarettes     Start date: 06/06/2007     Passive exposure: Current    Smokeless tobacco: Never   Vaping Use    Vaping status: Never Used   Substance and Sexual Activity    Alcohol use: Yes     Alcohol/week: 2.0 standard drinks of alcohol     Types: 2 Drinks containing 0.5 oz of alcohol per week    Drug use: Never    Sexual activity: Not on file   Other Topics Concern    Not on file   Social History Narrative    Not on file     Social Determinants of Health     Financial Resource Strain: Not on file   Food Insecurity: Not on file   Transportation Needs: Not on file   Physical Activity: Not on file   Stress: Not on file   Social Connections: Not on file   Intimate Partner Violence: Not on file   Housing Stability: Not on file        Allergies   Allergen Reactions    Iodinated Contrast Media Swelling     Swelling and hives         Current Outpatient Medications   Medication Sig Dispense Refill    NOVOLOG 100 UNIT/ML injection vial Inject into the skin      cyclobenzaprine (FLEXERIL) 5 MG tablet Take 1 tablet by mouth 2 times daily as needed for Muscle spasms      diazePAM (VALIUM) 2 MG tablet Take 1 tablet by mouth every 6 hours as needed for Anxiety.      vitamin D (ERGOCALCIFEROL) 1.25 MG (50000 UT) CAPS capsule Take 1 capsule by mouth once a week      BAQSIMI TWO PACK  3 MG/DOSE POWD INJECT 3 MILLIGRAM(S) NASAL EVERY DAY AS NEEDED FOR LOW BLOOD SUGAR       No current facility-administered medications for this visit.  Physical Exam  Constitutional:       Appearance: Normal appearance.   HENT:      Head: Normocephalic and atraumatic.      Mouth/Throat:      Mouth: Mucous membranes are moist.      Pharynx: Oropharynx is clear. No oropharyngeal exudate.   Eyes:      Extraocular Movements: Extraocular movements intact.      Pupils: Pupils are equal, round, and reactive to light.   Pulmonary:      Effort: Pulmonary effort is normal. No respiratory distress.   Musculoskeletal:         General: Normal range of motion.      Cervical back: Normal range of motion and neck supple.      Right lower leg: No edema.      Left lower leg: No edema.   Neurological:      Mental Status: She is alert.      Comments: Mental status: Awake, alert, oriented , follows simple and complex commands, no neglect, no extinction.  Speech and languge: fluent, coherent,  and comprehension intact  CN: VFF, EOMI, PERRLA, face sensation intact , no facial asymmetry noted, palate elevation symmetric bilat, SS+SCM 5/5 bilat, tongue midline  Motor: no pronator drift, tone normal throughout, strength 5/5 throughout  Sensory: Slightly decreased light touch and pinprick over the right medial leg.  Coordination: FNF  accurate w/o dysmetria  DTR: 2+ over the upper extremities; 1+ over the lower extremities.    Gait: Normal; mild difficulty with tandem walking.          No visits with results within 3 Month(s) from this visit.   Latest known visit with results is:   No results found for any previous visit.     Procedure: MRI BRAIN WITHOUT CONTRAST   Procedure: MRI CERVICAL SPINE WITHOUT CONTRAST     INDICATION: Multiple sclerosis, baseline, Z86.69 Personal history of other   diseases of the nervous system and sense organs     COMPARISON: None     TECHNIQUE/PROTOCOL:  Multiple sclerosis protocol MRI brain and cervical    spine without contrast performed.     BRAIN FINDINGS:   Number and orientation of nonenhancing T2/FLAIR/STIR lesions in   periventricular, juxtacortical, infratentorial regions: 5-10, predominantly   supratentorial white matter and brainstem.   Number of low-signal T1 lesions in periventricular, juxtacortical,   infratentorial regions: Less than 5   Presence of volume loss including the corpus callosum: No     Brain Parenchyma:  No hemorrhage, acute cortical infarction, mass, mass   effect, or midline shift.   Ventricles and Sulci: Normal for age.    Extra-Axial Spaces: No extra-axial fluid collection.   Basal Cisterns: Normal.   Intracranial Flow-Voids: Normal.     Paranasal Sinuses: Right maxillary mucous retention cyst.   Mastoid Sinuses: Normal.   Orbits: Normal.   Cranium: Normal.     CERVICAL SPINE FINDINGS:   Number and location of nonenhancing T2/FLAIR/STIR lesions in the cervical   spine: Single T2 hyperintense lesion at the level of C2-C3 within the   dorsal midline cervical cord.   Degenerative findings: Moderate disc height loss and osteophytosis are   noted at C5-C6 and C6-C7. Moderate right and mild left neural foraminal   stenosis at C5-C6 secondary to uncovertebral/facet hypertrophy. Mild left   neural foraminal narrowing at C6-C7 secondary to uncovertebral hypertrophy   and osteophyte. No significant spinal canal stenosis.     IMPRESSION:   Findings  compatible with demyelinating disease within the brain and   cervical cord, as detailed above. No significant brain parenchymal volume   loss.     Narrative & Impression: November 30 2022:  STUDY: MRI THORACIC SPINE WITHOUT CONTRAST     HISTORY: Multiple sclerosis     TECHNIQUE: Multisequence, multiplanar MR imaging was performed through the  thoracic spine without IV contrast material.      COMPARISON: None available.     FINDINGS:  Motion artifact limits examination of the spinal cord on axial imaging. Lack of  IV contrast limits evaluation for  active disease involvement.     Limited evaluation of the lower cervical spine demonstrates mild T2/STIR  edema-like signal involving the endplates of C6-C7, may be  degenerative/discogenic.     Thoracic alignment is maintained. The vertebral body heights are maintained and  demonstrate normal marrow signal intensity. There is minimal disc bulge  involving T9-T10, T10-T11, T12-L1, and L1-L2. The intervertebral disc heights  are relatively maintained. There is no canal stenosis throughout the thoracic  spine. No high-grade neuroforaminal narrowing.     The thoracic spinal cord is normal in size, contour, and signal intensity.     The conus medullaris terminates at T12-L1. There is a somewhat clumped  appearance of the visualized cauda equina nerve roots at L1-L2. Recommend  clinical correlation for arachnoiditis     The paraspinal soft tissues are normal.     IMPRESSION:     1.  Motion artifact and lack of IV contrast limit examination.  2.  Within these limitations, no discrete evidence for cord signal abnormality  involving the thoracic spine.  3.  No significant canal stenosis or neuroforaminal narrowing.  4.  Somewhat clumped appearance of the visualized cauda equina nerve roots.      Narrative & Impression  MR BRAIN WITHOUT CONTRAST  .HISTORY: Follow-up multiple sclerosis     COMPARISON: None available at time of dictation     TECHNIQUE: Multisequence multiplanar imaging of the brain obtained without  contrast.     FINDINGS:  Lesion burden: Small, approximately 10 small lesions  Supratentorial: Multiple juxtacortical line subcortical lesions in bilateral  frontal hemisphere.   Corpus callosum: Small lesion at the right lateral callosal interface. Callosal  volume is maintained.  Posterior fossa: Possible small lesion along the dorsal pons.     Brain parenchyma: No acute infarct, hemorrhage, or mass effect.     Extra-axial spaces, ventricles, basal cisterns: No extra-axial fluid collections  are identified. The  ventricles and sulci are within normal limits for patient's  age. Basal cisterns are patent.      Other:  Orbits: Within normal limits for technique.   Paranasal sinuses:Included paranasal sinuses are clear.  Mastoid air cells: Clear.   Calvarium and skull base: Calvarium is normal limits.         IMPRESSION:     Overall small burden of T2 FLAIR hyperintense lesions consistent with reported  history of multiple sclerosis. Prior examination not available at time of  dictation for direct comparison. No abnormal restricted diffusion to indicate  active demyelination.     Otherwise no acute intracranial process identified.    Assessment:  1. Multiple sclerosis (HCC)  Patient does not have significant deficit.  No exacerbations for a long time.  Brain MRI from September 13 only showed mild burden of mostly supratentorial MS lesions; about 10 number.  T-spine MRI unremarkable.  Based on her CSF exam, MRIs of the brain and cervical spine,  patient has a diagnosis of multiple sclerosis.  Also had optic neuritis previously.  Discussed different treatment options.  Since patient does not have any exacerbations for a long time and the lesion burden on her MRIs she is mild, prefers to hold off any medications at this time.  She will call our office if any new MS symptoms.  Have told her to go to the emergency room for any exacerbations.  I have told her to get the hepatitis B surface antigen and core antibody and JC virus antibody.  Will see her again in 6 months time.     I spent 30 minutes with the patient in face-to-face consultation, with 20 minutes spent in counseling and coordination of care as described above.     PLEASE NOTE:   This document has been produced using voice recognition software. Unrecognized errors in transcription may be present.

## 2022-12-10 NOTE — Progress Notes (Signed)
 Rhonda Weinheimer is a 47 y.o. female (DOB: [birth year]) presenting to address:    Chief Complaint   Patient presents with    Follow-up     3 month fu, Multiple sclerosis         Vitals:    12/10/22 1156   BP: 132/84   Pulse: 90   Resp: 19   Temp: 97.5 F (36.4 C)   SpO2: 98%       Coordination of Care Questionaire:   1. Have you been to the ER, urgent care clinic since your last visit?  Hospitalized since your last visit? no    2. Have you seen or consulted any other health care providers outside of the [facility] since your last visit? yes     3. For patients aged 28-75: Has the patient had a colonoscopy / FIT/ Cologuard? na      If the patient is female:    4. For patients aged 53-74: Has the patient had a mammogram within the past 2 years? yes      5. For patients aged 21-65: Has the patient had a pap smear? yes    Advanced Directive:   1. Do you have an Advanced Directive? no    2. Would you like information on Advanced Directives? no

## 2023-06-11 ENCOUNTER — Ambulatory Visit
Admit: 2023-06-11 | Discharge: 2023-06-11 | Payer: MEDICAID | Attending: Student in an Organized Health Care Education/Training Program | Primary: Student in an Organized Health Care Education/Training Program

## 2023-06-11 VITALS — BP 110/70 | HR 83 | Temp 96.80000°F | Resp 18 | Ht 64.0 in | Wt 115.6 lb

## 2023-06-11 DIAGNOSIS — G35 Multiple sclerosis: Secondary | ICD-10-CM

## 2023-06-11 NOTE — Progress Notes (Signed)
 Melissa Banks is a 48 y.o. female .presents for Follow-up  A 48 years old female patient here for follow-up of multiple sclerosis.  Last seen in the clinic in September 2024.  No relapses or exacerbations of multiple sclerosis.  No changes in her vision.  No diplopia.  No numbness over her face.  Occasionally, this might get numb.  Might wake up at night; might have tingling.  A couple of times a week.  No lower extremity numbness.  No weakness.  No spasms.  Neck feels stiff; no Lhermitte's-like phenomenon.  No problem walking.  No unsteadiness.  No falls.  No bladder/bowel dysfunction.    From initial encounter:  A 48 years old female patient referred here for evaluation of multiple sclerosis. Her symptoms started in September 2018 with blurring of vision on the left side; black spots on the left side.  She also had pain over the left eye.  No diplopia.  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  Had a lumbar puncture which had shown CSF IgG index was high; positive oligoclonal bands.  MRI of the brain from 2018 was reported to be normal.  C-spine MRI from September 2018 had shown  small focus of T2 signal abnormality in the cord at C2-3 which is nonspecific with might represent demyelinating disease.  Dimethyl fumarate was recommended; she thought that it was for research and refused to take it.  Subsequently seen at [facility] in August 2019.  MRI of the brain done without contrast [she had allergic reaction to the previous contrast for the MRI; gadolinium]: : 5-10, predominantly  supratentorial FLAIR hyperintense lesions over the white matter and brainstem.  C-spine MRI from the same time showed Single T2 hyperintense lesion at the level of C2-C3 within the  dorsal midline cervical cord.  She did not have any subsequent follow-up.  She mentions that the initial vision changes subsided in about 1 month time.  Few years before her left eye vision changes, she mentioned transient episodes of  numbness and weakness over the right hand which subsided in 2 weeks time.  She mentioned another episode years ago where she had a transient numbness and weakness over the right foot which got better in about a week time.  There is no recurrence of her visual problem.  She currently complains of numbness in her hands bilaterally; mostly involving fingers 4 and 5 and medial hand.  No involvement of the arm.  No elbow pain.  Has neck pain for the past 10 years; nonradiating.  Does not have Lhermitte's-like phenomenon.  No worsening symptoms with heat exposure.  She does not have bladder or bowel dysfunction.  No problem with her balance; no unsteadiness.  Does not fall.  No fatigue.  Has insulin-dependent diabetes since age of 48.  She has insulin infusion.  She was seen at the spine center.  MRI of the cervical spine without contrast from March 2024 done at [facility] had shown small focus of increased T2/STIR signal within the posterior central aspect suggesting an area of demyelination.      Review of Systems   Constitutional:  Negative for chills, fever and unexpected weight change.   HENT:  Negative for hearing loss and tinnitus.    Eyes:  Negative for visual disturbance.   Respiratory:  Negative for cough and shortness of breath.    Cardiovascular:  Negative for chest pain and leg swelling.   Gastrointestinal:  Negative for nausea and vomiting.  Genitourinary:  Negative for dysuria, frequency and urgency.   Musculoskeletal:  Positive for neck pain. Negative for back pain.   Skin:  Negative for rash.   Neurological:  Positive for numbness. Negative for dizziness, tremors, seizures, syncope, facial asymmetry, speech difficulty, weakness and headaches.         Past Medical History:   Diagnosis Date    Anxiety     Diabetes mellitus (HCC)     Vitamin D deficiency         No past surgical history on file.     No family history on file.     Social History     Socioeconomic History    Marital status: Single      Spouse name: Not on file    Number of children: Not on file    Years of education: Not on file    Highest education level: Not on file   Occupational History    Not on file   Tobacco Use    Smoking status: Some Days     Current packs/day: 0.50     Average packs/day: 0.5 packs/day for 16.0 years (8.0 ttl pk-yrs)     Types: Cigarettes     Start date: 06/06/2007     Passive exposure: Current    Smokeless tobacco: Never   Vaping Use    Vaping status: Never Used   Substance and Sexual Activity    Alcohol use: Yes     Alcohol/week: 2.0 standard drinks of alcohol     Types: 2 Drinks containing 0.5 oz of alcohol per week    Drug use: Never    Sexual activity: Not on file   Other Topics Concern    Not on file   Social History Narrative    Not on file     Social Drivers of Health     Financial Resource Strain: Not on file   Food Insecurity: Not on file   Transportation Needs: Not on file   Physical Activity: Not on file   Stress: Not on file   Social Connections: Not on file   Intimate Partner Violence: Not on file   Housing Stability: Not on file        Allergies   Allergen Reactions    Iodinated Contrast Media Swelling     Swelling and hives         Current Outpatient Medications   Medication Sig Dispense Refill    HUMALOG 100 UNIT/ML SOLN injection vial INFUSE 75 UNIT(S) SUBCUTANEOUSLY EVERY DAY VIA INSULIN PUMP      cyclobenzaprine (FLEXERIL) 5 MG tablet Take 1 tablet by mouth 2 times daily as needed for Muscle spasms      diazePAM (VALIUM) 2 MG tablet Take 1 tablet by mouth every 6 hours as needed for Anxiety.      vitamin D (ERGOCALCIFEROL) 1.25 MG (50000 UT) CAPS capsule Take 1 capsule by mouth once a week      BAQSIMI TWO PACK 3 MG/DOSE POWD INJECT 3 MILLIGRAM(S) NASAL EVERY DAY AS NEEDED FOR LOW BLOOD SUGAR       No current facility-administered medications for this visit.       Physical Exam  Constitutional:       Appearance: Normal appearance.   HENT:      Head: Normocephalic and atraumatic.      Mouth/Throat:       Mouth: Mucous membranes are moist.      Pharynx: Oropharynx is clear. No oropharyngeal exudate.  Eyes:      Extraocular Movements: Extraocular movements intact.      Pupils: Pupils are equal, round, and reactive to light.   Pulmonary:      Effort: Pulmonary effort is normal. No respiratory distress.   Musculoskeletal:         General: Normal range of motion.      Cervical back: Normal range of motion and neck supple.      Right lower leg: No edema.      Left lower leg: No edema.   Neurological:      Mental Status: She is alert.      Comments: Mental status: Awake, alert, oriented , follows simple and complex commands, no neglect, no extinction.  Speech and languge: fluent, coherent,  and comprehension intact  CN: VFF, EOMI, PERRLA, face sensation intact , no facial asymmetry noted, palate elevation symmetric bilat, SS+SCM 5/5 bilat, tongue midline  Motor: no pronator drift, tone normal throughout, strength 5/5 throughout  Sensory: Intact to LT and PP.   Coordination: FNF  accurate w/o dysmetria  DTR: 2+ over the upper extremities; 1+ over the lower extremities.    Gait: Normal.          No visits with results within 3 Month(s) from this visit.   Latest known visit with results is:   No results found for any previous visit.     Procedure: MRI BRAIN WITHOUT CONTRAST   Procedure: MRI CERVICAL SPINE WITHOUT CONTRAST     INDICATION: Multiple sclerosis, baseline, Z86.69 Personal history of other   diseases of the nervous system and sense organs     COMPARISON: None     TECHNIQUE/PROTOCOL:  Multiple sclerosis protocol MRI brain and cervical   spine without contrast performed.     BRAIN FINDINGS:   Number and orientation of nonenhancing T2/FLAIR/STIR lesions in   periventricular, juxtacortical, infratentorial regions: 5-10, predominantly   supratentorial white matter and brainstem.   Number of low-signal T1 lesions in periventricular, juxtacortical,   infratentorial regions: Less than 5   Presence of volume loss including  the corpus callosum: No     Brain Parenchyma:  No hemorrhage, acute cortical infarction, mass, mass   effect, or midline shift.   Ventricles and Sulci: Normal for age.    Extra-Axial Spaces: No extra-axial fluid collection.   Basal Cisterns: Normal.   Intracranial Flow-Voids: Normal.     Paranasal Sinuses: Right maxillary mucous retention cyst.   Mastoid Sinuses: Normal.   Orbits: Normal.   Cranium: Normal.     CERVICAL SPINE FINDINGS:   Number and location of nonenhancing T2/FLAIR/STIR lesions in the cervical   spine: Single T2 hyperintense lesion at the level of C2-C3 within the   dorsal midline cervical cord.   Degenerative findings: Moderate disc height loss and osteophytosis are   noted at C5-C6 and C6-C7. Moderate right and mild left neural foraminal   stenosis at C5-C6 secondary to uncovertebral/facet hypertrophy. Mild left   neural foraminal narrowing at C6-C7 secondary to uncovertebral hypertrophy   and osteophyte. No significant spinal canal stenosis.     IMPRESSION:   Findings compatible with demyelinating disease within the brain and   cervical cord, as detailed above. No significant brain parenchymal volume   loss.     Narrative & Impression: November 30 2022:  STUDY: MRI THORACIC SPINE WITHOUT CONTRAST     HISTORY: Multiple sclerosis     TECHNIQUE: Multisequence, multiplanar MR imaging was performed through the  thoracic spine without IV  contrast material.      COMPARISON: None available.     FINDINGS:  Motion artifact limits examination of the spinal cord on axial imaging. Lack of  IV contrast limits evaluation for active disease involvement.     Limited evaluation of the lower cervical spine demonstrates mild T2/STIR  edema-like signal involving the endplates of C6-C7, may be  degenerative/discogenic.     Thoracic alignment is maintained. The vertebral body heights are maintained and  demonstrate normal marrow signal intensity. There is minimal disc bulge  involving T9-T10, T10-T11, T12-L1, and  L1-L2. The intervertebral disc heights  are relatively maintained. There is no canal stenosis throughout the thoracic  spine. No high-grade neuroforaminal narrowing.     The thoracic spinal cord is normal in size, contour, and signal intensity.     The conus medullaris terminates at T12-L1. There is a somewhat clumped  appearance of the visualized cauda equina nerve roots at L1-L2. Recommend  clinical correlation for arachnoiditis     The paraspinal soft tissues are normal.     IMPRESSION:     1.  Motion artifact and lack of IV contrast limit examination.  2.  Within these limitations, no discrete evidence for cord signal abnormality  involving the thoracic spine.  3.  No significant canal stenosis or neuroforaminal narrowing.  4.  Somewhat clumped appearance of the visualized cauda equina nerve roots.      Narrative & Impression  MR BRAIN WITHOUT CONTRAST  .HISTORY: Follow-up multiple sclerosis     COMPARISON: None available at time of dictation     TECHNIQUE: Multisequence multiplanar imaging of the brain obtained without  contrast.     FINDINGS:  Lesion burden: Small, approximately 10 small lesions  Supratentorial: Multiple juxtacortical line subcortical lesions in bilateral  frontal hemisphere.   Corpus callosum: Small lesion at the right lateral callosal interface. Callosal  volume is maintained.  Posterior fossa: Possible small lesion along the dorsal pons.     Brain parenchyma: No acute infarct, hemorrhage, or mass effect.     Extra-axial spaces, ventricles, basal cisterns: No extra-axial fluid collections  are identified. The ventricles and sulci are within normal limits for patient's  age. Basal cisterns are patent.      Other:  Orbits: Within normal limits for technique.   Paranasal sinuses:Included paranasal sinuses are clear.  Mastoid air cells: Clear.   Calvarium and skull base: Calvarium is normal limits.         IMPRESSION:     Overall small burden of T2 FLAIR hyperintense lesions consistent with  reported  history of multiple sclerosis. Prior examination not available at time of  dictation for direct comparison. No abnormal restricted diffusion to indicate  active demyelination.     Otherwise no acute intracranial process identified.    Assessment:  1. Relapsing remitting multiple sclerosis (HCC)  - MRI BRAIN WO CONTRAST; Future  - MRI LUMBAR SPINE WO CONTRAST; Future  - MRI THORACIC SPINE WO CONTRAST; Future  Patient does not have relapses of her multiple sclerosis since the last visit.  No obvious neurodeficits.  No significant disability.  Will repeat her MRIs before her next visit in 6 months time: MRIs of the brain, C-spine, and T-spine with and without contrast.  Discussed symptoms of MS relapses and the need to go to the emergency room.  Follow-up in 6 months time.    I spent 30 minutes with the patient in face-to-face consultation, with 20 minutes spent in counseling and coordination of  care as described above.     PLEASE NOTE:   This document has been produced using voice recognition software. Unrecognized errors in transcription may be present.

## 2023-10-01 NOTE — Telephone Encounter (Signed)
 Pt states due to Multiple ER visits she need MRI for lower body.

## 2023-10-02 NOTE — Telephone Encounter (Signed)
 Called to inform pt that there are active MRI orders in the system. Pt is requesting to have  orders sent to [facility].

## 2023-10-02 NOTE — Progress Notes (Signed)
 Faxed MRI orders to [facility] central scheduling.

## 2023-10-15 NOTE — Telephone Encounter (Signed)
 Patient needs peer to peer for all 3 MRI, she is scheduled for 8/1 but would need solution by end of tomorrow.     [facility] [phone] ( order id # [id number] )

## 2023-11-12 ENCOUNTER — Encounter
Payer: Medicaid (Managed Care) | Attending: Student in an Organized Health Care Education/Training Program | Primary: Student in an Organized Health Care Education/Training Program

## 2023-12-03 NOTE — Telephone Encounter (Signed)
 MRI results scanned in media.

## 2023-12-04 NOTE — Telephone Encounter (Signed)
 Patient was advised of MRI results per Dr. Nadia Aurora.

## 2023-12-13 ENCOUNTER — Encounter
Payer: Medicaid (Managed Care) | Attending: Student in an Organized Health Care Education/Training Program | Primary: Student in an Organized Health Care Education/Training Program
